# Patient Record
Sex: Female | Born: 1965
Health system: Southern US, Community
[De-identification: ages and names within clinical notes are randomized; demographics above are authoritative.]

## PROBLEM LIST (undated history)

## (undated) DIAGNOSIS — N6019 Diffuse cystic mastopathy of unspecified breast: Secondary | ICD-10-CM

## (undated) DIAGNOSIS — L509 Urticaria, unspecified: Secondary | ICD-10-CM

## (undated) DIAGNOSIS — L309 Dermatitis, unspecified: Secondary | ICD-10-CM

## (undated) DIAGNOSIS — E782 Mixed hyperlipidemia: Secondary | ICD-10-CM

## (undated) DIAGNOSIS — F419 Anxiety disorder, unspecified: Secondary | ICD-10-CM

## (undated) DIAGNOSIS — C4491 Basal cell carcinoma of skin, unspecified: Secondary | ICD-10-CM

## (undated) DIAGNOSIS — E7800 Pure hypercholesterolemia, unspecified: Secondary | ICD-10-CM

## (undated) DIAGNOSIS — D649 Anemia, unspecified: Secondary | ICD-10-CM

## (undated) DIAGNOSIS — A630 Anogenital (venereal) warts: Secondary | ICD-10-CM

## (undated) DIAGNOSIS — L723 Sebaceous cyst: Secondary | ICD-10-CM

## (undated) DIAGNOSIS — R079 Chest pain, unspecified: Secondary | ICD-10-CM

## (undated) DIAGNOSIS — M1990 Unspecified osteoarthritis, unspecified site: Secondary | ICD-10-CM

## (undated) DIAGNOSIS — T7840XA Allergy, unspecified, initial encounter: Secondary | ICD-10-CM

## (undated) HISTORY — DX: Anxiety disorder, unspecified: F41.9

## (undated) HISTORY — PX: WISDOM TOOTH EXTRACTION: SHX21

## (undated) HISTORY — DX: Anogenital (venereal) warts: A63.0

## (undated) HISTORY — DX: Unspecified osteoarthritis, unspecified site: M19.90

## (undated) HISTORY — DX: Sebaceous cyst: L72.3

## (undated) HISTORY — PX: BASAL CELL CARCINOMA EXCISION: SHX1214

## (undated) HISTORY — DX: Basal cell carcinoma of skin, unspecified: C44.91

## (undated) HISTORY — DX: Pure hypercholesterolemia, unspecified: E78.00

## (undated) HISTORY — DX: Allergy, unspecified, initial encounter: T78.40XA

## (undated) HISTORY — DX: Mixed hyperlipidemia: E78.2

## (undated) HISTORY — DX: Dermatitis, unspecified: L30.9

## (undated) HISTORY — DX: Anemia, unspecified: D64.9

## (undated) HISTORY — DX: Urticaria, unspecified: L50.9

## (undated) HISTORY — DX: Diffuse cystic mastopathy of unspecified breast: N60.19

## (undated) HISTORY — DX: Chest pain, unspecified: R07.9

---

## 1998-01-16 ENCOUNTER — Ambulatory Visit (HOSPITAL_COMMUNITY): Admission: RE | Admit: 1998-01-16 | Discharge: 1998-01-16 | Payer: Self-pay | Admitting: *Deleted

## 1998-05-07 ENCOUNTER — Ambulatory Visit (HOSPITAL_COMMUNITY): Admission: RE | Admit: 1998-05-07 | Discharge: 1998-05-07 | Payer: Self-pay | Admitting: *Deleted

## 1998-05-16 ENCOUNTER — Ambulatory Visit (HOSPITAL_COMMUNITY): Admission: RE | Admit: 1998-05-16 | Discharge: 1998-05-16 | Payer: Self-pay | Admitting: *Deleted

## 2000-05-13 ENCOUNTER — Other Ambulatory Visit: Admission: RE | Admit: 2000-05-13 | Discharge: 2000-05-13 | Payer: Self-pay | Admitting: Obstetrics and Gynecology

## 2000-11-29 ENCOUNTER — Inpatient Hospital Stay (HOSPITAL_COMMUNITY): Admission: AD | Admit: 2000-11-29 | Discharge: 2000-12-01 | Payer: Self-pay | Admitting: Obstetrics and Gynecology

## 2002-04-21 ENCOUNTER — Other Ambulatory Visit: Admission: RE | Admit: 2002-04-21 | Discharge: 2002-04-21 | Payer: Self-pay | Admitting: Obstetrics and Gynecology

## 2003-11-16 ENCOUNTER — Ambulatory Visit (HOSPITAL_BASED_OUTPATIENT_CLINIC_OR_DEPARTMENT_OTHER): Admission: RE | Admit: 2003-11-16 | Discharge: 2003-11-16 | Payer: Self-pay | Admitting: Plastic Surgery

## 2003-11-16 ENCOUNTER — Ambulatory Visit (HOSPITAL_COMMUNITY): Admission: RE | Admit: 2003-11-16 | Discharge: 2003-11-16 | Payer: Self-pay | Admitting: Plastic Surgery

## 2004-01-19 ENCOUNTER — Other Ambulatory Visit: Admission: RE | Admit: 2004-01-19 | Discharge: 2004-01-19 | Payer: Self-pay | Admitting: Obstetrics and Gynecology

## 2004-07-17 ENCOUNTER — Other Ambulatory Visit: Admission: RE | Admit: 2004-07-17 | Discharge: 2004-07-17 | Payer: Self-pay | Admitting: Obstetrics and Gynecology

## 2006-02-20 ENCOUNTER — Other Ambulatory Visit: Admission: RE | Admit: 2006-02-20 | Discharge: 2006-02-20 | Payer: Self-pay | Admitting: Obstetrics and Gynecology

## 2008-12-15 DIAGNOSIS — A630 Anogenital (venereal) warts: Secondary | ICD-10-CM

## 2008-12-15 HISTORY — DX: Anogenital (venereal) warts: A63.0

## 2011-02-25 ENCOUNTER — Other Ambulatory Visit: Payer: Self-pay | Admitting: Dermatology

## 2012-03-01 ENCOUNTER — Other Ambulatory Visit: Payer: Self-pay | Admitting: Dermatology

## 2012-04-28 ENCOUNTER — Telehealth: Payer: Self-pay | Admitting: Obstetrics and Gynecology

## 2012-04-28 NOTE — Telephone Encounter (Signed)
Laura/SR pt. °

## 2012-04-30 NOTE — Telephone Encounter (Signed)
LM with female at home number to return call  ld

## 2012-05-20 ENCOUNTER — Ambulatory Visit (INDEPENDENT_AMBULATORY_CARE_PROVIDER_SITE_OTHER): Payer: 59 | Admitting: Psychology

## 2012-05-20 DIAGNOSIS — F4323 Adjustment disorder with mixed anxiety and depressed mood: Secondary | ICD-10-CM

## 2012-06-03 ENCOUNTER — Ambulatory Visit (INDEPENDENT_AMBULATORY_CARE_PROVIDER_SITE_OTHER): Payer: 59 | Admitting: Psychology

## 2012-06-03 DIAGNOSIS — F4323 Adjustment disorder with mixed anxiety and depressed mood: Secondary | ICD-10-CM

## 2012-06-24 ENCOUNTER — Ambulatory Visit (INDEPENDENT_AMBULATORY_CARE_PROVIDER_SITE_OTHER): Payer: 59 | Admitting: Psychology

## 2012-06-24 DIAGNOSIS — F4323 Adjustment disorder with mixed anxiety and depressed mood: Secondary | ICD-10-CM

## 2012-07-13 ENCOUNTER — Ambulatory Visit: Payer: 59 | Admitting: Psychology

## 2012-11-29 ENCOUNTER — Encounter: Payer: Self-pay | Admitting: *Deleted

## 2012-11-30 ENCOUNTER — Encounter: Payer: Self-pay | Admitting: Cardiology

## 2012-11-30 ENCOUNTER — Ambulatory Visit (INDEPENDENT_AMBULATORY_CARE_PROVIDER_SITE_OTHER): Payer: 59 | Admitting: Cardiology

## 2012-11-30 VITALS — BP 107/64 | HR 68 | Ht 64.0 in | Wt 142.0 lb

## 2012-11-30 DIAGNOSIS — M79602 Pain in left arm: Secondary | ICD-10-CM

## 2012-11-30 DIAGNOSIS — R002 Palpitations: Secondary | ICD-10-CM

## 2012-11-30 DIAGNOSIS — M79609 Pain in unspecified limb: Secondary | ICD-10-CM

## 2012-11-30 NOTE — Patient Instructions (Signed)
Eliminate caffeine in your diet.  If you continue to have palpitations let me know and we can have you wear a monitor.

## 2012-11-30 NOTE — Progress Notes (Signed)
Gabriela Ramirez Date of Birth: 01/10/1966 Medical Record #[id number]  History of Present Illness: Gabriela Ramirez is seen at the request of Dr. Oneta Rack for evaluation of palpitations. She is a pleasant 44 are old white female who has been in excellent health. Recently she has had a lot of personal issues and marital stress. When she is under periods of stress she notices a sense of anxiety and tightness and she becomes somewhat agitated. She complains that her heart races. This is more common in the evenings when she is in a more relaxed state. She has noticed some pressure in her ears with activity. She complains of a stiffness in her left arm and it feels like it is heated. She does drink a lot of diet [facility]. Her only cardiac risk factor is history of mild hypercholesterolemia. She states her LDL was 120.  Current Outpatient Prescriptions on File Prior to Visit  Medication Sig Dispense Refill  . ALPRAZolam (XANAX) 0.25 MG tablet Take 0.25 mg by mouth 2 (two) times daily.        No Known Allergies  Past Medical History  Diagnosis Date  . Basal cell carcinoma     multiple  . Elevated cholesterol   . HPV (human papilloma virus) anogenital infection 2010  . Fibrocystic breast   . Sebaceous cyst     multiple  . Anxiety   . Chest pain     Past Surgical History  Procedure Date  . Basal cell carcinoma excision     multiple    History  Smoking status  . Never Smoker   Smokeless tobacco  . Not on file    History  Alcohol Use  . Yes    Comment: social    Family History  Problem Relation Age of Onset  . Adopted: Yes    Review of Systems: As noted in history of present illness.  All other systems were reviewed and are negative.  Physical Exam: BP 107/64  Pulse 68  Ht 5\' 4"  (1.626 m)  Wt 64.411 kg (142 lb)  BMI 24.37 kg/m2 She is a pleasant white female in no acute distress.The patient is alert and oriented x 3.  The mood and affect are normal.  The skin is warm  and dry.  Color is normal.  The HEENT exam reveals that the sclera are nonicteric.  The mucous membranes are moist.  The carotids are 2+ without bruits.  There is no thyromegaly.  There is no JVD.  The lungs are clear.  The chest wall is non tender.  The heart exam reveals a regular rate with a normal S1 and S2.  There are no murmurs, gallops, or rubs.  The PMI is not displaced.   Abdominal exam reveals good bowel sounds.  There is no guarding or rebound.  There is no hepatosplenomegaly or tenderness.  There are no masses.  Exam of the legs reveal no clubbing, cyanosis, or edema.  The legs are without rashes.  The distal pulses are intact.  Cranial nerves II - XII are intact.  Motor and sensory functions are intact.  The gait is normal.  LABORATORY DATA: ECG demonstrates normal sinus rhythm with mild nonspecific ST abnormality.  Assessment / Plan: 1. Palpitations. I suspect that these symptoms are predominantly related to stress. I have recommended that she limit her caffeine intake. We discussed further evaluation with an event monitor which she would like to wait and see how she does off of caffeine. If she  does have persistent palpitations she is to call is then we can arrange for her to wear a monitor. I think overall her risk is low.  2. Atypical left arm pain. Patient has a low risk of coronary disease. Her symptoms are very atypical. I've encouraged her to continue with her aerobic activity. I do not feel that stress testing is warranted.  3. Situational stress/anxiety.

## 2012-12-20 ENCOUNTER — Encounter: Payer: Self-pay | Admitting: Cardiology

## 2013-01-05 ENCOUNTER — Ambulatory Visit: Payer: Self-pay | Admitting: Obstetrics and Gynecology

## 2013-03-03 ENCOUNTER — Other Ambulatory Visit: Payer: Self-pay | Admitting: Dermatology

## 2013-04-04 ENCOUNTER — Other Ambulatory Visit: Payer: Self-pay | Admitting: Dermatology

## 2013-04-21 ENCOUNTER — Other Ambulatory Visit (HOSPITAL_COMMUNITY): Payer: Self-pay | Admitting: Internal Medicine

## 2013-04-21 DIAGNOSIS — D219 Benign neoplasm of connective and other soft tissue, unspecified: Secondary | ICD-10-CM

## 2013-04-26 ENCOUNTER — Ambulatory Visit (HOSPITAL_COMMUNITY)
Admission: RE | Admit: 2013-04-26 | Discharge: 2013-04-26 | Disposition: A | Discharge status: Home / Self Care | Payer: 59 | Source: Ambulatory Visit | Attending: Internal Medicine | Admitting: Internal Medicine

## 2013-04-26 DIAGNOSIS — D252 Subserosal leiomyoma of uterus: Secondary | ICD-10-CM | POA: Insufficient documentation

## 2013-04-26 DIAGNOSIS — D649 Anemia, unspecified: Secondary | ICD-10-CM | POA: Insufficient documentation

## 2013-04-26 DIAGNOSIS — N949 Unspecified condition associated with female genital organs and menstrual cycle: Secondary | ICD-10-CM | POA: Insufficient documentation

## 2013-04-26 DIAGNOSIS — D219 Benign neoplasm of connective and other soft tissue, unspecified: Secondary | ICD-10-CM

## 2013-04-26 DIAGNOSIS — N938 Other specified abnormal uterine and vaginal bleeding: Secondary | ICD-10-CM | POA: Insufficient documentation

## 2013-04-26 DIAGNOSIS — D250 Submucous leiomyoma of uterus: Secondary | ICD-10-CM | POA: Insufficient documentation

## 2013-07-21 ENCOUNTER — Encounter (HOSPITAL_COMMUNITY): Payer: Self-pay | Admitting: Pharmacist

## 2013-07-25 ENCOUNTER — Other Ambulatory Visit: Payer: Self-pay | Admitting: Obstetrics and Gynecology

## 2013-07-26 NOTE — Addendum Note (Signed)
Addended by: Silverio Lay on: 07/26/2013 10:28 AM   Modules accepted: Orders

## 2013-07-28 ENCOUNTER — Other Ambulatory Visit: Payer: Self-pay | Admitting: Obstetrics and Gynecology

## 2013-07-29 ENCOUNTER — Encounter (HOSPITAL_COMMUNITY): Payer: Self-pay | Admitting: Anesthesiology

## 2013-07-29 ENCOUNTER — Encounter (HOSPITAL_COMMUNITY)
Admission: RE | Disposition: A | Discharge status: Home / Self Care | Payer: Self-pay | Source: Ambulatory Visit | Attending: Obstetrics and Gynecology

## 2013-07-29 ENCOUNTER — Ambulatory Visit (HOSPITAL_COMMUNITY)
Admission: RE | Admit: 2013-07-29 | Discharge: 2013-07-29 | Disposition: A | Discharge status: Home / Self Care | Payer: 59 | Source: Ambulatory Visit | Attending: Obstetrics and Gynecology | Admitting: Obstetrics and Gynecology

## 2013-07-29 ENCOUNTER — Ambulatory Visit (HOSPITAL_COMMUNITY): Payer: 59 | Admitting: Anesthesiology

## 2013-07-29 DIAGNOSIS — N920 Excessive and frequent menstruation with regular cycle: Secondary | ICD-10-CM | POA: Insufficient documentation

## 2013-07-29 DIAGNOSIS — N854 Malposition of uterus: Secondary | ICD-10-CM | POA: Insufficient documentation

## 2013-07-29 DIAGNOSIS — E7800 Pure hypercholesterolemia, unspecified: Secondary | ICD-10-CM | POA: Insufficient documentation

## 2013-07-29 DIAGNOSIS — R002 Palpitations: Secondary | ICD-10-CM | POA: Insufficient documentation

## 2013-07-29 DIAGNOSIS — D250 Submucous leiomyoma of uterus: Secondary | ICD-10-CM | POA: Insufficient documentation

## 2013-07-29 DIAGNOSIS — N840 Polyp of corpus uteri: Secondary | ICD-10-CM | POA: Insufficient documentation

## 2013-07-29 HISTORY — PX: DILITATION & CURRETTAGE/HYSTROSCOPY WITH VERSAPOINT RESECTION: SHX5571

## 2013-07-29 HISTORY — PX: NOVASURE ABLATION: SHX5394

## 2013-07-29 LAB — PREGNANCY, URINE: Preg Test, Ur: NEGATIVE

## 2013-07-29 LAB — CBC
HCT: 35.8 % — ABNORMAL LOW (ref 36.0–46.0)
Hemoglobin: 11.6 g/dL — ABNORMAL LOW (ref 12.0–15.0)
MCH: 25.7 pg — ABNORMAL LOW (ref 26.0–34.0)
RBC: 4.51 MIL/uL (ref 3.87–5.11)

## 2013-07-29 SURGERY — DILATATION & CURETTAGE/HYSTEROSCOPY WITH VERSAPOINT RESECTION
Anesthesia: General | Site: Vagina | Wound class: Clean Contaminated

## 2013-07-29 MED ORDER — CHLOROPROCAINE HCL 1 % IJ SOLN
INTRAMUSCULAR | Status: DC | PRN
  Administered 2013-07-29: 20 mL

## 2013-07-29 MED ORDER — KETOROLAC TROMETHAMINE 30 MG/ML IJ SOLN
INTRAMUSCULAR | Status: DC | PRN
  Administered 2013-07-29: 30 mg via INTRAVENOUS

## 2013-07-29 MED ORDER — CHLOROPROCAINE HCL 1 % IJ SOLN
INTRAMUSCULAR | Status: AC
  Filled 2013-07-29: qty 30

## 2013-07-29 MED ORDER — MEPERIDINE HCL 25 MG/ML IJ SOLN: 6.2500 mg | INTRAMUSCULAR | Status: DC | PRN

## 2013-07-29 MED ORDER — LACTATED RINGERS IV SOLN
INTRAVENOUS | Status: DC
  Administered 2013-07-29 (×2): via INTRAVENOUS

## 2013-07-29 MED ORDER — SCOPOLAMINE 1 MG/3DAYS TD PT72
MEDICATED_PATCH | TRANSDERMAL | Status: AC
  Filled 2013-07-29: qty 1

## 2013-07-29 MED ORDER — PROMETHAZINE HCL 25 MG/ML IJ SOLN: 6.2500 mg | INTRAMUSCULAR | Status: DC | PRN

## 2013-07-29 MED ORDER — KETOROLAC TROMETHAMINE 60 MG/2ML IM SOLN
INTRAMUSCULAR | Status: DC | PRN
  Administered 2013-07-29: 30 mg via INTRAMUSCULAR

## 2013-07-29 MED ORDER — CEFAZOLIN SODIUM-DEXTROSE 2-3 GM-% IV SOLR: 2.0000 g | INTRAVENOUS | Status: DC

## 2013-07-29 MED ORDER — MIDAZOLAM HCL 2 MG/2ML IJ SOLN: 0.5000 mg | Freq: Once | INTRAMUSCULAR | Status: DC | PRN

## 2013-07-29 MED ORDER — SODIUM CHLORIDE 0.9 % IR SOLN
Status: DC | PRN
  Administered 2013-07-29: 3000 mL

## 2013-07-29 MED ORDER — PROPOFOL 10 MG/ML IV BOLUS
INTRAVENOUS | Status: DC | PRN
  Administered 2013-07-29: 200 mg via INTRAVENOUS

## 2013-07-29 MED ORDER — FENTANYL CITRATE 0.05 MG/ML IJ SOLN
INTRAMUSCULAR | Status: DC | PRN
  Administered 2013-07-29 (×2): 25 ug via INTRAVENOUS
  Administered 2013-07-29: 100 ug via INTRAVENOUS

## 2013-07-29 MED ORDER — SCOPOLAMINE 1 MG/3DAYS TD PT72
MEDICATED_PATCH | TRANSDERMAL | Status: DC | PRN
  Administered 2013-07-29: 1 via TRANSDERMAL

## 2013-07-29 MED ORDER — FENTANYL CITRATE 0.05 MG/ML IJ SOLN
INTRAMUSCULAR | Status: AC
  Filled 2013-07-29: qty 2

## 2013-07-29 MED ORDER — KETOROLAC TROMETHAMINE 30 MG/ML IJ SOLN
INTRAMUSCULAR | Status: AC
  Filled 2013-07-29: qty 2

## 2013-07-29 MED ORDER — GLYCOPYRROLATE 0.2 MG/ML IJ SOLN
INTRAMUSCULAR | Status: AC
  Filled 2013-07-29: qty 1

## 2013-07-29 MED ORDER — CEFAZOLIN SODIUM-DEXTROSE 2-3 GM-% IV SOLR
2.0000 g | INTRAVENOUS | Status: AC
  Administered 2013-07-29: 2 g via INTRAVENOUS

## 2013-07-29 MED ORDER — HYDROCODONE-ACETAMINOPHEN 5-300 MG PO TABS: ORAL_TABLET | ORAL | Status: DC

## 2013-07-29 MED ORDER — LIDOCAINE HCL (CARDIAC) 20 MG/ML IV SOLN
INTRAVENOUS | Status: AC
  Filled 2013-07-29: qty 5

## 2013-07-29 MED ORDER — MIDAZOLAM HCL 2 MG/2ML IJ SOLN
INTRAMUSCULAR | Status: AC
  Filled 2013-07-29: qty 2

## 2013-07-29 MED ORDER — LIDOCAINE HCL (CARDIAC) 20 MG/ML IV SOLN
INTRAVENOUS | Status: DC | PRN
  Administered 2013-07-29: 60 mg via INTRAVENOUS

## 2013-07-29 MED ORDER — PROPOFOL 10 MG/ML IV EMUL
INTRAVENOUS | Status: AC
  Filled 2013-07-29: qty 20

## 2013-07-29 MED ORDER — IBUPROFEN 600 MG PO TABS: 600.0000 mg | ORAL_TABLET | Freq: Four times a day (QID) | ORAL | Status: DC | PRN

## 2013-07-29 MED ORDER — DEXAMETHASONE SODIUM PHOSPHATE 4 MG/ML IJ SOLN
INTRAMUSCULAR | Status: DC | PRN
  Administered 2013-07-29: 10 mg via INTRAVENOUS

## 2013-07-29 MED ORDER — DEXAMETHASONE SODIUM PHOSPHATE 10 MG/ML IJ SOLN
INTRAMUSCULAR | Status: AC
  Filled 2013-07-29: qty 1

## 2013-07-29 MED ORDER — ONDANSETRON HCL 4 MG/2ML IJ SOLN
INTRAMUSCULAR | Status: AC
  Filled 2013-07-29: qty 2

## 2013-07-29 MED ORDER — MIDAZOLAM HCL 5 MG/5ML IJ SOLN
INTRAMUSCULAR | Status: DC | PRN
  Administered 2013-07-29: 2 mg via INTRAVENOUS

## 2013-07-29 MED ORDER — FENTANYL CITRATE 0.05 MG/ML IJ SOLN: 25.0000 ug | INTRAMUSCULAR | Status: DC | PRN

## 2013-07-29 MED ORDER — KETOROLAC TROMETHAMINE 30 MG/ML IJ SOLN: 15.0000 mg | Freq: Once | INTRAMUSCULAR | Status: DC | PRN

## 2013-07-29 MED ORDER — ONDANSETRON HCL 4 MG/2ML IJ SOLN
INTRAMUSCULAR | Status: DC | PRN
  Administered 2013-07-29: 4 mg via INTRAVENOUS

## 2013-07-29 MED ORDER — CEFAZOLIN SODIUM-DEXTROSE 2-3 GM-% IV SOLR
INTRAVENOUS | Status: AC
  Filled 2013-07-29: qty 50

## 2013-07-29 MED ORDER — GLYCOPYRROLATE 0.2 MG/ML IJ SOLN
INTRAMUSCULAR | Status: DC | PRN
  Administered 2013-07-29: 0.1 mg via INTRAVENOUS

## 2013-07-29 SURGICAL SUPPLY — 25 items
ABLATOR ENDOMETRIAL BIPOLAR (ABLATOR) ×2 IMPLANT
BOOTIES KNEE HIGH SLOAN (MISCELLANEOUS) ×4 IMPLANT
CANISTER SUCTION 2500CC (MISCELLANEOUS) ×2 IMPLANT
CATH ROBINSON RED A/P 16FR (CATHETERS) ×2 IMPLANT
CLOTH BEACON ORANGE TIMEOUT ST (SAFETY) ×2 IMPLANT
CONTAINER PREFILL 10% NBF 60ML (FORM) ×4 IMPLANT
CORD ACTIVE DISPOSABLE (ELECTRODE)
CORD ELECTRO ACTIVE DISP (ELECTRODE) IMPLANT
DRESSING TELFA 8X3 (GAUZE/BANDAGES/DRESSINGS) ×2 IMPLANT
ELECT REM PT RETURN 9FT ADLT (ELECTROSURGICAL) ×2
ELECTRODE REM PT RTRN 9FT ADLT (ELECTROSURGICAL) ×1 IMPLANT
ELECTRODE ROLLER VERSAPOINT (ELECTRODE) IMPLANT
ELECTRODE RT ANGLE VERSAPOINT (CUTTING LOOP) ×2 IMPLANT
GLOVE BIOGEL PI IND STRL 7.0 (GLOVE) ×2 IMPLANT
GLOVE BIOGEL PI INDICATOR 7.0 (GLOVE) ×2
GOWN PREVENTION PLUS LG XLONG (DISPOSABLE) ×4 IMPLANT
GOWN STRL REIN XL XLG (GOWN DISPOSABLE) ×4 IMPLANT
LOOP ANGLED CUTTING 22FR (CUTTING LOOP) IMPLANT
NEEDLE SPNL 22GX3.5 QUINCKE BK (NEEDLE) ×2 IMPLANT
PACK HYSTEROSCOPY LF (CUSTOM PROCEDURE TRAY) ×2 IMPLANT
PACK VAGINAL MINOR WOMEN LF (CUSTOM PROCEDURE TRAY) ×2 IMPLANT
PAD OB MATERNITY 4.3X12.25 (PERSONAL CARE ITEMS) ×2 IMPLANT
PAD PREP 24X48 CUFFED NSTRL (MISCELLANEOUS) ×2 IMPLANT
TOWEL OR 17X24 6PK STRL BLUE (TOWEL DISPOSABLE) ×2 IMPLANT
WATER STERILE IRR 1000ML POUR (IV SOLUTION) ×2 IMPLANT

## 2013-07-29 NOTE — Anesthesia Preprocedure Evaluation (Addendum)
Anesthesia Evaluation  Patient identified by MRN, date of birth, ID band Patient awake    Reviewed: Allergy & Precautions, H&P , Patient's Chart, lab work & pertinent test results, reviewed documented beta blocker date and time   History of Anesthesia Complications Negative for: history of anesthetic complications  Airway Mallampati: I TM Distance: >3 FB Neck ROM: full    Dental no notable dental hx. (+) Teeth Intact   Pulmonary neg pulmonary ROS,  breath sounds clear to auscultation  Pulmonary exam normal       Cardiovascular Exercise Tolerance: Good negative cardio ROS  Rhythm:regular Rate:Normal     Neuro/Psych negative neurological ROS  negative psych ROS   GI/Hepatic negative GI ROS, Neg liver ROS,   Endo/Other  negative endocrine ROS  Renal/GU negative Renal ROS  Female GU complaint     Musculoskeletal   Abdominal Normal abdominal exam  (+)   Peds  Hematology negative hematology ROS (+)   Anesthesia Other Findings   Reproductive/Obstetrics negative OB ROS                          Anesthesia Physical Anesthesia Plan  ASA: I  Anesthesia Plan: General LMA   Post-op Pain Management:    Induction:   Airway Management Planned:   Additional Equipment:   Intra-op Plan:   Post-operative Plan:   Informed Consent: I have reviewed the patients History and Physical, chart, labs and discussed the procedure including the risks, benefits and alternatives for the proposed anesthesia with the patient or authorized representative who has indicated his/her understanding and acceptance.   Dental Advisory Given  Plan Discussed with: CRNA, Surgeon and Anesthesiologist  Anesthesia Plan Comments:        Anesthesia Quick Evaluation

## 2013-07-29 NOTE — Anesthesia Procedure Notes (Signed)
Procedure Name: LMA Insertion Date/Time: 07/29/2013 10:26 AM Performed by: Graciela Husbands Pre-anesthesia Checklist: Suction available, Emergency Drugs available, Timeout performed, Patient identified and Patient being monitored Patient Re-evaluated:Patient Re-evaluated prior to inductionOxygen Delivery Method: Circle system utilized Preoxygenation: Pre-oxygenation with 100% oxygen Intubation Type: IV induction Ventilation: Mask ventilation without difficulty LMA: LMA inserted LMA Size: 4.0 Number of attempts: 1 Placement Confirmation: breath sounds checked- equal and bilateral and positive ETCO2 Tube secured with: Tape Dental Injury: Teeth and Oropharynx as per pre-operative assessment

## 2013-07-29 NOTE — H&P (Signed)
  Chief Complaint Menorrhagia  Past Medical History: Human papilloma virus infection - Onset: 04/2007\ls1 Palpitations Hypercholesterolemia  Allergies NKDA  Medications  Name Date Source  magnesium 05/28/14entered Jeannie Martin  Slow Release Iron\pardevery other day 05/28/14entered Marissa Nestle  Vitamin D3 5,000 unit tablet\pardTake 1 tablet(s) twice a day by oral route. 05/28/14entered Marissa Nestle   GYN History G2P2 Cycle: Monthly  flow: lasts 7 days  abnormal PAP:  04/2007 ASCUS +HPV Birth Control Method: Vasectomy. Partner Vasectomy.       LIVING           Social History Never smoker. Alcohol: [email or web address] drugs: NONE. 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  Family History:ADOPTED   Surgical History: wisdomw teeth and gyn colposcopy        Wt:          142 lbs 06/29/2013 03:22 pm          Ht:          5 ft 4 in 06/29/2013 02:05 pm          BMI:          24.4 06/29/2013 03:22 pm  BP: 106/62 sitting R arm 06/29/2013 03:22 pm      ROS Constitutional: Constitutional: no fever, fatigue, or significant weight loss/gain Cardiovascular: Cardiovascular: no palpitations, SOB, orthopnea, edema, or chest pain. Gastrointestinal: Gastrointestinal: no heartburn, indigestion, nausea, vomiting, difficulty swallowing, abdominal pain, or bowel movement changes. Genitourinary: Genitourinary: no vaginal odor or itching and no hematuria, incontinence, rash, lesion, discharge, abnormal bleeding, flank pain, or trouble urinating. Endocrine: Endocrine: no endocrine symptoms. Menstrual cycle: no PMDD symptoms. Menopausal: no menopausal symptoms. Sexual problems: no sexual problems. Psychological: Psychiatric: no depression or alcoholism.  Physical Exam Patient is a 47 year old female Constitutional: General Appearance: healthy-appearing, well-nourished, and well-developed.\ Psychiatric: Orientation: to time, place, and person.  Mood and Affect: normal mood and affect and active and alert.\ Skin: Appearance: no rashes or lesions.\ Ears, Nose, Throat: Oral Cavity general condition: good and (normal) teeth. Ears ENT WNL. Nose (normal) nose. Throat (normal) throat.\ Neck: Neck: supple, FROM, trachea midline, and no masses. Thyroid: no enlargement or nodules and non-tender.\ Lungs: Respiratory Effort: no intercostal retractions or accessory muscle usage. Auscultation: no wheezing, rales/crackles, or rhonchi and clear to auscultation.\ Cardiovascular: Auscultation: RRR and no murmur. Peripheral Vascular: no varicosities, LLE edema, RLE edema, calf tenderness, or palpable cords and pedal pulses intact.\ Abdomen: Auscultation/Inspection/Palpation: no tenderness, hepatomegaly, splenomegaly, masses, or CVA tenderness and soft, non-distended, and normal bowel sounds. Hernia: none palpated.\ Breast: Inspection/Palpation: no tenderness, skin changes, abnormal secretions, or distinct masses and nipple appearance normal.\ Female Genitalia: Vulva: no masses, atrophy, or lesions. Vagina: no tenderness, erythema, cystocele, rectocele, abnormal vaginal discharge, or vesicle(s) or ulcers. Cervix: no discharge or cervical motion tenderness and grossly normal. Uterus: normal size and shape and midline, mobile, non-tender, and no uterine prolapse. Bladder/Urethra: no urethral discharge or mass and normal meatus, bladder non distended, and Urethra well supported. Adnexa/Parametria: no parametrial tenderness or mass and no adnexal tenderness or ovarian mass.\ Lymph Nodes: Palpation: non tender submandibular nodes, axillary nodes, or inguinal nodes.\SHG reveals 3 submucosal fibroid with a normal measuring cavity.  SHG: 2.3 cm right lower wall submucosal fibroid and 1 cl LUS fibroid  Assessment / Plan  HSC Versapoint myomectomy Novasure endometrial ablation Procedure, R&B reviewed with pt. Questions answered

## 2013-07-29 NOTE — Transfer of Care (Signed)
Immediate Anesthesia Transfer of Care Note  Patient: Gabriela Ramirez  Procedure(s) Performed: Procedure(s) with comments: DILATATION & CURETTAGE/HYSTEROSCOPY WITH VERSAPOINT RESECTION (N/A) - Hysteroscopy, Versapoint Myomectomy, D&C, Novasure   NOVASURE ABLATION (N/A)  Patient Location: PACU  Anesthesia Type:General  Level of Consciousness: awake, alert  and oriented  Airway & Oxygen Therapy: Patient Spontanous Breathing and Patient connected to nasal cannula oxygen  Post-op Assessment: Report given to PACU RN and Post -op Vital signs reviewed and stable  Post vital signs: Reviewed and stable  Complications: No apparent anesthesia complications

## 2013-07-29 NOTE — Op Note (Signed)
Preop diagnosis: DUB  Postop diagnosis: same  Anesthesia: general   Anesthesiologist: Dr. Dana Allan  Procedure: Hysteroscopy, resection of endometrial polyps, Versapoint myomectomy, D&C and Novasure endometrial ablation  Surgeon: Dr. Dois Davenport Artem Bunte  Procedure: After being informed of the planned procedure with possible complications including bleeding, infection and uterine perforation, informed consent was obtained and patient was taken to or #8.  She was given general anesthesia without complication. She was placed in a dorsal decubitus position, prepped and draped in the sterile fashion and a Foley catheter was inserted in her bladder. Pelvic exam reveals retroverted uterus normal in size and 2 normal adnexa.  A weighted speculum is inserted in the vagina. The cervix was grasped with a tenaculum forcep placed on the anterior lip.We proceed with a paracervical block using 1% Nesacaine, 20 cc. Uterus is sounded at 9 and cervix is measured at 3 cm. The cervix is then easily dilated using Hegar dilator until # 33. This allows for easy placement of a Versapoint hysteroscope. With perfusion of normal saline at a maximum pressure of 90 mmHg, we are able to evaluate the entire uterine cavity.  Observation: 2 endometrial polyps in the right LUS measuring 1 and 1 cm as well as a 2.5 cm submucosal fibroid on the right lateral wall. Both tubal ostia are seen and normal.  We proceed with the resection of the 2 polyps and the fibroid without difficulty. We then removed our instrumentation. Using a sharp curette, we proceed with curettage of the endometrial cavity which returns a small amount of normal-appearing endometrium.  Novasure is easily inserted in the uterine cavity which now has a normal configuration and set at 6 cm length and 4.5 cm width. Cavity integrity is confirmed and the Novasure is triggered for 64 seconds. Novasure is removed and a second look hysteroscopy reveals a nicely blanched  cavity.  Instruments are then removed. The anterior lip of the cervix is repaired with 2-0 Vicryl. Instrument and sponge count is complete x2. Estimated blood loss is minimal. Water deficit is 140 cc of normal saline..  The procedure is very well tolerated by the patient who is taken to recovery room in a well and stable condition.  Specimen: Endometrial polyps,endometrial curettings and fragments of fibroid are sent to pathology.

## 2013-07-29 NOTE — Interval H&P Note (Signed)
History and Physical Interval Note:  07/29/2013 9:49 AM  Gabriela Ramirez  has presented today for surgery, with the diagnosis of menorrhagia, submucosal fibroids,   The various methods of treatment have been discussed with the patient and family. After consideration of risks, benefits and other options for treatment, the patient has consented to  Procedure(s) with comments: DILATATION & CURETTAGE/HYSTEROSCOPY WITH VERSAPOINT RESECTION (N/A) - Hysteroscopy, Versapoint Myomectomy, D&C, Novasure   NOVASURE ABLATION (N/A) as a surgical intervention .  The patient's history has been reviewed, patient examined, no change in status, stable for surgery.  I have reviewed the patient's chart and labs.  Questions were answered to the patient's satisfaction.     Merril Nagy A

## 2013-08-01 ENCOUNTER — Encounter (HOSPITAL_COMMUNITY): Payer: Self-pay | Admitting: Obstetrics and Gynecology

## 2013-08-05 NOTE — Anesthesia Postprocedure Evaluation (Signed)
  Anesthesia Post-op Note  Anesthesia Post Note  Patient: Gabriela Ramirez  Procedure(s) Performed: Procedure(s) (LRB): DILATATION & CURETTAGE/HYSTEROSCOPY WITH VERSAPOINT RESECTION (N/A) NOVASURE ABLATION (N/A)  Anesthesia type: General  Patient location: PACU  Post pain: Pain level controlled  Post assessment: Post-op Vital signs reviewed  Last Vitals:  Filed Vitals:   07/29/13 1223  BP:   Pulse:   Temp: 36.4 C  Resp: 18    Post vital signs: Reviewed  Level of consciousness: sedated  Complications: No apparent anesthesia complications

## 2013-10-20 ENCOUNTER — Other Ambulatory Visit: Payer: Self-pay

## 2013-11-12 ENCOUNTER — Encounter: Payer: Self-pay | Admitting: Internal Medicine

## 2013-11-12 DIAGNOSIS — D649 Anemia, unspecified: Secondary | ICD-10-CM | POA: Insufficient documentation

## 2013-11-14 ENCOUNTER — Ambulatory Visit (INDEPENDENT_AMBULATORY_CARE_PROVIDER_SITE_OTHER): Payer: 59 | Admitting: Emergency Medicine

## 2013-11-14 ENCOUNTER — Other Ambulatory Visit: Payer: Self-pay | Admitting: Emergency Medicine

## 2013-11-14 ENCOUNTER — Encounter: Payer: Self-pay | Admitting: Emergency Medicine

## 2013-11-14 VITALS — BP 104/62 | HR 54 | Temp 98.0°F | Resp 16 | Ht 65.25 in | Wt 147.0 lb

## 2013-11-14 DIAGNOSIS — Z0000 Encounter for general adult medical examination without abnormal findings: Secondary | ICD-10-CM

## 2013-11-14 DIAGNOSIS — Z1212 Encounter for screening for malignant neoplasm of rectum: Secondary | ICD-10-CM

## 2013-11-14 DIAGNOSIS — Z111 Encounter for screening for respiratory tuberculosis: Secondary | ICD-10-CM

## 2013-11-14 DIAGNOSIS — R21 Rash and other nonspecific skin eruption: Secondary | ICD-10-CM

## 2013-11-14 LAB — BASIC METABOLIC PANEL WITH GFR
CO2: 28 mEq/L (ref 19–32)
Calcium: 9.3 mg/dL (ref 8.4–10.5)
Creat: 0.64 mg/dL (ref 0.50–1.10)
GFR, Est African American: >89 mL/min
GFR, Est Non African American: >89 mL/min
Sodium: 136 mEq/L (ref 135–145)

## 2013-11-14 LAB — CBC WITH DIFFERENTIAL/PLATELET
Basophils Absolute: 0 10*3/uL (ref 0.0–0.1)
Basophils Relative: 1 % (ref 0–1)
Eosinophils Absolute: 0.2 10*3/uL (ref 0.0–0.7)
Eosinophils Relative: 3 % (ref 0–5)
MCH: 27.3 pg (ref 26.0–34.0)
MCHC: 33.2 g/dL (ref 30.0–36.0)
MCV: 82.3 fL (ref 78.0–100.0)
Neutrophils Relative %: 51 % (ref 43–77)
Platelets: 262 10*3/uL (ref 150–400)
RBC: 4.8 MIL/uL (ref 3.87–5.11)
RDW: 15 % (ref 11.5–15.5)

## 2013-11-14 LAB — LIPID PANEL
HDL: 66 mg/dL (ref >39)
LDL Cholesterol: 110 mg/dL — ABNORMAL HIGH (ref 0–99)
VLDL: 12 mg/dL (ref 0–40)

## 2013-11-14 LAB — IRON AND TIBC
%SAT: 11 % — ABNORMAL LOW (ref 20–55)
Iron: 53 ug/dL (ref 42–145)
TIBC: 494 ug/dL — ABNORMAL HIGH (ref 250–470)

## 2013-11-14 LAB — TSH: TSH: 1.44 u[IU]/mL (ref 0.350–4.500)

## 2013-11-14 LAB — HEPATIC FUNCTION PANEL
Alkaline Phosphatase: 52 U/L (ref 39–117)
Bilirubin, Direct: 0.1 mg/dL (ref 0.0–0.3)
Indirect Bilirubin: 0.5 mg/dL (ref 0.0–0.9)
Total Protein: 7.2 g/dL (ref 6.0–8.3)

## 2013-11-14 LAB — HEMOGLOBIN A1C: Hgb A1c MFr Bld: 5.6 % (ref <5.7)

## 2013-11-14 MED ORDER — PREDNISONE 10 MG PO TABS: ORAL_TABLET | ORAL | Status: DC

## 2013-11-14 NOTE — Patient Instructions (Addendum)
Anemia, Nonspecific Anemia is a condition in which the concentration of red blood cells or hemoglobin in the blood is below normal. Hemoglobin is a substance in red blood cells that carries oxygen to the tissues of the body. Anemia results in not enough oxygen reaching these tissues.  CAUSES  Common causes of anemia include:   Excessive bleeding. Bleeding may be internal or external. This includes excessive bleeding from periods (in women) or from the intestine.   Poor nutrition.   Chronic kidney, thyroid, and liver disease.  Bone marrow disorders that decrease red blood cell production.  Cancer and treatments for cancer.  HIV, AIDS, and their treatments.  Spleen problems that increase red blood cell destruction.  Blood disorders.  Excess destruction of red blood cells due to infection, medicines, and autoimmune disorders. SIGNS AND SYMPTOMS   Minor weakness.   Dizziness.   Headache.  Palpitations.   Shortness of breath, especially with exercise.   Paleness.  Cold sensitivity.  Indigestion.  Nausea.  Difficulty sleeping.  Difficulty concentrating. Symptoms may occur suddenly or they may develop slowly.  DIAGNOSIS  Additional blood tests are often needed. These help your health care provider determine the best treatment. Your health care provider will check your stool for blood and look for other causes of blood loss.  TREATMENT  Treatment varies depending on the cause of the anemia. Treatment can include:   Supplements of iron, vitamin B12, or folic acid.   Hormone medicines.   A blood transfusion. This may be needed if blood loss is severe.   Hospitalization. This may be needed if there is significant continual blood loss.   Dietary changes.  Spleen removal. HOME CARE INSTRUCTIONS Keep all follow-up appointments. It often takes many weeks to correct anemia, and having your health care provider check on your condition and your response to  treatment is very important. SEEK IMMEDIATE MEDICAL CARE IF:   You develop extreme weakness, shortness of breath, or chest pain.   You become dizzy or have trouble concentrating.  You develop heavy vaginal bleeding.   You develop a rash.   You have bloody or black, tarry stools.   You faint.   You vomit up blood.   You vomit repeatedly.   You have abdominal pain.  You have a fever or persistent symptoms for more than 2 3 days.   You have a fever and your symptoms suddenly get worse.   You are dehydrated.  MAKE SURE YOU:  Understand these instructions.  Will watch your condition.  Will get help right away if you are not doing well or get worse. Document Released: 01/08/2005 Document Revised: 08/03/2013 Document Reviewed: 05/27/2013 [facility] Patient Information 2014 [place], [place]. Rash A rash is a change in the color or feel of your skin. There are many different types of rashes. You may have other problems along with your rash. HOME CARE  Avoid the thing that caused your rash.  Do not scratch your rash.  You may take cools baths to help stop itching.  Only take medicines as told by your doctor.  Keep all doctor visits as told. GET HELP RIGHT AWAY IF:   Your pain, puffiness (swelling), or redness gets worse.  You have a fever.  You have new or severe problems.  You have body aches, watery poop (diarrhea), or you throw up (vomit).  Your rash is not better after 3 days. MAKE SURE YOU:   Understand these instructions.  Will watch your condition.  Will get  help right away if you are not doing well or get worse. Document Released: 05/19/2008 Document Revised: 02/23/2012 Document Reviewed: 09/15/2011 [facility] Patient Information 2014 [place], [place].

## 2013-11-15 LAB — URINALYSIS, MICROSCOPIC ONLY
Bacteria, UA: NONE SEEN
Casts: NONE SEEN
Squamous Epithelial / LPF: NONE SEEN

## 2013-11-15 LAB — INSULIN, FASTING: Insulin fasting, serum: 4 u[IU]/mL (ref 3–28)

## 2013-11-15 LAB — URINALYSIS, ROUTINE W REFLEX MICROSCOPIC
Bilirubin Urine: NEGATIVE
Glucose, UA: NEGATIVE mg/dL
Protein, ur: NEGATIVE mg/dL
pH: 6 (ref 5.0–8.0)

## 2013-11-16 LAB — URINE CULTURE
Colony Count: NO GROWTH
Organism ID, Bacteria: NO GROWTH

## 2013-11-16 NOTE — Progress Notes (Signed)
Subjective:    Patient ID: Gabriela Ramirez, female    DOB: May 20, 1966, 47 y.o.   MRN: [id number]  HPI Comments: 47 yo WF CPE with history of anemia nad vitamin defiency. She has stopped all supplements and is feeling well overall. She notes fatigue only when stress level is elevated. She notes stress has improved with separation from spouse. She is not eating as healthy or exercising as regularly as she was previously.  She has noticed a mildly irritated rash around neck and upper back. She has tried to change lotion and soap to see if any improvement with out relief.  She did have fibroidectomy/ ablation in 8/14 and is still having occasional cycles but improved from prior to surgery.   Anemia    Current Outpatient Prescriptions on File Prior to Visit  Medication Sig Dispense Refill  . Cholecalciferol (VITAMIN D3 MAXIMUM STRENGTH) 5000 UNITS capsule Take 5,000 Units by mouth 2 (two) times daily.      . Ferrous Sulfate (SLOW FE PO) Take by mouth daily.      . fexofenadine-pseudoephedrine (ALLEGRA-D 24) 180-240 MG per 24 hr tablet Take 1 tablet by mouth daily as needed.      . Hydrocodone-Acetaminophen (VICODIN) 5-300 MG TABS Take 1-2 tablets by mouth every 4-6 hours as needed for pain  24 each  0  . ibuprofen (ADVIL,MOTRIN) 600 MG tablet Take 1 tablet (600 mg total) by mouth every 6 (six) hours as needed for pain.  30 tablet  0  . Magnesium 250 MG TABS Take by mouth daily.       No current facility-administered medications on file prior to visit.    Review of patient's allergies indicates no known allergies.  Past Medical History  Diagnosis Date  . Basal cell carcinoma     multiple  . Elevated cholesterol   . HPV (human papilloma virus) anogenital infection 2010  . Fibrocystic breast   . Sebaceous cyst     multiple  . Anxiety   . Chest pain   . Anemia     Past Surgical History  Procedure Laterality Date  . Basal cell carcinoma excision      multiple  . Dilitation &  currettage/hystroscopy with versapoint resection N/A 07/29/2013    Procedure: DILATATION & CURETTAGE/HYSTEROSCOPY WITH VERSAPOINT RESECTION;  Surgeon: Esmeralda Arthur, MD;  Location: [facility];  Service: Gynecology;  Laterality: N/A;  Hysteroscopy, Versapoint Myomectomy, D&C, Novasure    . Novasure ablation N/A 07/29/2013    Procedure: NOVASURE ABLATION;  Surgeon: Esmeralda Arthur, MD;  Location: [facility];  Service: Gynecology;  Laterality: N/A;    History   Social History  . Marital Status: Married    Spouse Name: N/A    Number of Children: 2  . Years of Education: N/A   Occupational History  . staffing firm manager    Social History Main Topics  . Smoking status: Never Smoker   . Smokeless tobacco: None  . Alcohol Use: Yes     Comment: social  . Drug Use: No  . Sexual Activity: None   Other Topics Concern  . None   Social History Narrative  . None    Family History  Problem Relation Age of Onset  . Adopted: Yes     Review of Systems  Skin: Positive for rash.  All other systems reviewed and are negative.   BP 104/62  Pulse 54  Temp(Src) 98 F (36.7 C) (Temporal)  Resp 16  Ht 5'  5.25" (1.657 m)  Wt 147 lb (66.679 kg)  BMI 24.29 kg/m2  LMP 11/09/2013     Objective:   Physical Exam  Nursing note and vitals reviewed. Constitutional: She is oriented to person, place, and time. She appears well-developed and well-nourished. No distress.  HENT:  Head: Normocephalic and atraumatic.  Right Ear: External ear normal.  Left Ear: External ear normal.  Nose: Nose normal.  Mouth/Throat: Oropharynx is clear and moist. No oropharyngeal exudate.  Eyes: Conjunctivae and EOM are normal. Pupils are equal, round, and reactive to light. No scleral icterus.  Neck: Normal range of motion. Neck supple. No JVD present. No tracheal deviation present. No thyromegaly present.  Cardiovascular: Normal rate, regular rhythm, normal heart sounds and intact distal pulses.   Pulmonary/Chest:  Effort normal and breath sounds normal.  Abdominal: Soft. Bowel sounds are normal. She exhibits no distension and no mass. There is no tenderness. There is no rebound and no guarding.  Genitourinary:  DEF to gyn  Musculoskeletal: Normal range of motion. She exhibits no edema and no tenderness.  Lymphadenopathy:    She has no cervical adenopathy.  Neurological: She is alert and oriented to person, place, and time. She has normal reflexes. No cranial nerve deficit. Coordination normal.  Skin: Skin is warm and dry. Rash noted. No erythema. No pallor.  Upper chest/ back, neck erythematous mildly elevated splotchy, non patterned rash  Psychiatric: She has a normal mood and affect. Her behavior is normal. Judgment and thought content normal.          Assessment & Plan:  1. CPE recheck screening labs 2. Rash upper chest/ Back, hygiene explained , Pred 10 mg DP, Benadryl AD if no change f/u Derm

## 2014-04-04 ENCOUNTER — Ambulatory Visit (INDEPENDENT_AMBULATORY_CARE_PROVIDER_SITE_OTHER): Payer: 59 | Admitting: Emergency Medicine

## 2014-04-04 ENCOUNTER — Encounter: Payer: Self-pay | Admitting: Emergency Medicine

## 2014-04-04 VITALS — BP 118/62 | HR 58 | Temp 98.6°F | Resp 16 | Ht 65.0 in | Wt 148.0 lb

## 2014-04-04 DIAGNOSIS — E559 Vitamin D deficiency, unspecified: Secondary | ICD-10-CM

## 2014-04-04 DIAGNOSIS — R5383 Other fatigue: Secondary | ICD-10-CM

## 2014-04-04 DIAGNOSIS — R635 Abnormal weight gain: Secondary | ICD-10-CM

## 2014-04-04 DIAGNOSIS — E782 Mixed hyperlipidemia: Secondary | ICD-10-CM

## 2014-04-04 DIAGNOSIS — R5381 Other malaise: Secondary | ICD-10-CM

## 2014-04-04 HISTORY — DX: Mixed hyperlipidemia: E78.2

## 2014-04-04 LAB — LIPID PANEL
Cholesterol: 176 mg/dL (ref 0–200)
HDL: 67 mg/dL (ref >39)
LDL CALC: 96 mg/dL (ref 0–99)
Total CHOL/HDL Ratio: 2.6 Ratio
Triglycerides: 63 mg/dL (ref <150)
VLDL: 13 mg/dL (ref 0–40)

## 2014-04-04 LAB — HEPATIC FUNCTION PANEL
ALT: 15 U/L (ref 0–35)
AST: 20 U/L (ref 0–37)
Albumin: 4.2 g/dL (ref 3.5–5.2)
Alkaline Phosphatase: 51 U/L (ref 39–117)
BILIRUBIN INDIRECT: 0.6 mg/dL (ref 0.2–1.2)
Bilirubin, Direct: 0.2 mg/dL (ref 0.0–0.3)
Total Bilirubin: 0.8 mg/dL (ref 0.2–1.2)
Total Protein: 6.7 g/dL (ref 6.0–8.3)

## 2014-04-04 LAB — CBC WITH DIFFERENTIAL/PLATELET
BASOS ABS: 0.1 10*3/uL (ref 0.0–0.1)
Basophils Relative: 1 % (ref 0–1)
Eosinophils Absolute: 0.3 10*3/uL (ref 0.0–0.7)
Eosinophils Relative: 5 % (ref 0–5)
HCT: 38.4 % (ref 36.0–46.0)
Hemoglobin: 12.8 g/dL (ref 12.0–15.0)
LYMPHS ABS: 1.9 10*3/uL (ref 0.7–4.0)
Lymphocytes Relative: 34 % (ref 12–46)
MCH: 27.6 pg (ref 26.0–34.0)
MCHC: 33.3 g/dL (ref 30.0–36.0)
MCV: 82.9 fL (ref 78.0–100.0)
Monocytes Absolute: 0.4 10*3/uL (ref 0.1–1.0)
Monocytes Relative: 8 % (ref 3–12)
NEUTROS ABS: 2.9 10*3/uL (ref 1.7–7.7)
NEUTROS PCT: 52 % (ref 43–77)
Platelets: 294 10*3/uL (ref 150–400)
RBC: 4.63 MIL/uL (ref 3.87–5.11)
RDW: 14.3 % (ref 11.5–15.5)
WBC: 5.6 10*3/uL (ref 4.0–10.5)

## 2014-04-04 LAB — BASIC METABOLIC PANEL WITH GFR
BUN: 17 mg/dL (ref 6–23)
CO2: 28 mEq/L (ref 19–32)
Calcium: 9.1 mg/dL (ref 8.4–10.5)
Chloride: 102 mEq/L (ref 96–112)
Creat: 0.68 mg/dL (ref 0.50–1.10)
GFR, Est African American: >89 mL/min
GFR, Est Non African American: >89 mL/min
GLUCOSE: 83 mg/dL (ref 70–99)
Potassium: 4.2 mEq/L (ref 3.5–5.3)
SODIUM: 137 meq/L (ref 135–145)

## 2014-04-04 LAB — TSH: TSH: 1.801 u[IU]/mL (ref 0.350–4.500)

## 2014-04-04 LAB — VITAMIN B12: VITAMIN B 12: 365 pg/mL (ref 211–911)

## 2014-04-04 LAB — MAGNESIUM: MAGNESIUM: 2 mg/dL (ref 1.5–2.5)

## 2014-04-04 NOTE — Progress Notes (Signed)
Subjective:    Patient ID: Gabriela Ramirez, female    DOB: 10/26/1966, 48 y.o.   MRN: [id number]  HPI Comments: 48 yo WF 3 month f/u Cholesterol/ vitamin D. She has noticed mild weight gain and stomach bloating since ablation. She is eating healthy and trying to eat more veggies/ fruits. She notes mild constipation. She is exercising routinely 2-6 days a week.. She did not continue vitamins AD. She notes stress level has improved.  CHOL         188   11/14/2013 HDL           66   11/14/2013 LDLCALC      110   11/14/2013 TRIG          58   11/14/2013 CHOLHDL      2.8   11/14/2013 ALT           20   11/14/2013 AST           29   11/14/2013 ALKPHOS       52   11/14/2013 BILITOT      0.6   11/14/2013      Medication List       This list is accurate as of: 04/04/14 10:48 AM.  Always use your most recent med list.               MULTIVITAMIN PO  Take by mouth daily.       No Known Allergies  Past Medical History  Diagnosis Date  . Basal cell carcinoma     multiple  . Elevated cholesterol   . HPV (human papilloma virus) anogenital infection 2010  . Fibrocystic breast   . Sebaceous cyst     multiple  . Anxiety   . Chest pain   . Anemia      Review of Systems  Constitutional: Positive for fatigue and unexpected weight change.  Gastrointestinal: Positive for constipation.  All other systems reviewed and are negative.  BP 118/62  Pulse 58  Temp(Src) 98.6 F (37 C) (Temporal)  Resp 16  Ht 5\' 5"  (1.651 m)  Wt 148 lb (67.132 kg)  BMI 24.63 kg/m2  LMP 04/04/2014     Objective:   Physical Exam  Nursing note and vitals reviewed. Constitutional: She is oriented to person, place, and time. She appears well-developed and well-nourished. No distress.  HENT:  Head: Normocephalic and atraumatic.  Right Ear: External ear normal.  Left Ear: External ear normal.  Nose: Nose normal.  Mouth/Throat: Oropharynx is clear and moist. No oropharyngeal exudate.  Eyes: Conjunctivae and  EOM are normal.  Neck: Normal range of motion. Neck supple. No JVD present. No thyromegaly present.  Cardiovascular: Normal rate, regular rhythm, normal heart sounds and intact distal pulses.   Pulmonary/Chest: Effort normal and breath sounds normal.  Abdominal: Soft. Bowel sounds are normal. She exhibits no distension and no mass. There is no tenderness. There is no rebound and no guarding.  Musculoskeletal: Normal range of motion. She exhibits no edema and no tenderness.  Lymphadenopathy:    She has no cervical adenopathy.  Neurological: She is alert and oriented to person, place, and time. No cranial nerve deficit.  Skin: Skin is warm and dry. No rash noted. No erythema. No pallor.  Psychiatric: She has a normal mood and affect. Her behavior is normal. Judgment and thought content normal.          Assessment & Plan:  1. Cholesterol- recheck labs, Need to eat  healthier and exercise AD.  2. Vit D low add 5000 IU, may help with energy/ immune system improvement. Check labs  3. Weight gain vs pre-menopause vs age vs Fatigue vs constipation- check labs, increase activity and H2O, add 250 mg Magnesium

## 2014-04-04 NOTE — Patient Instructions (Signed)
Constipation, Adult Constipation is when a person:  Poops (bowel movement) less than 3 times a week.  Has a hard time pooping.  Has poop that is dry, hard, or bigger than normal. HOME CARE   Eat more fiber, such as fruits, vegetables, whole grains like brown rice, and beans.  Eat less fatty foods and sugar. This includes [place] fries, hamburgers, cookies, candy, and soda.  If you are not getting enough fiber from food, take products with added fiber in them (supplements).  Drink enough fluid to keep your pee (urine) clear or pale yellow.  Go to the restroom when you feel like you need to poop. Do not hold it.  Only take medicine as told by your doctor. Do not take medicines that help you poop (laxatives) without talking to your doctor first.  Exercise on a regular basis, or as told by your doctor. GET HELP RIGHT AWAY IF:   You have bright red blood in your poop (stool).  Your constipation lasts more than 4 days or gets worse.  You have belly (abdomen) or butt (rectal) pain.  You have thin poop (as thin as a pencil).  You lose weight, and it cannot be explained. MAKE SURE YOU:   Understand these instructions.  Will watch your condition.  Will get help right away if you are not doing well or get worse. Document Released: 05/19/2008 Document Revised: 02/23/2012 Document Reviewed: 09/12/2013 [facility] Patient Information 2014 [place], [place].   Bad carbs also include fruit juice, alcohol, and sweet tea. These are empty calories that do not signal to your brain that you are full.   Please remember the good carbs are still carbs which convert into sugar. So please measure them out no more than 1/2-1 cup of rice, oatmeal, pasta, and beans.  Veggies are however free foods! Pile them on.   I like lean protein at every meal such as chicken, [place], pork chops, cottage cheese, etc. Just do not fry these meats and please center your meal around vegetable, the meats should be a  side dish.   No all fruit is created equal. Please see the list below, the fruit at the bottom is higher in sugars than the fruit at the top

## 2014-04-05 LAB — VITAMIN D 25 HYDROXY (VIT D DEFICIENCY, FRACTURES): Vit D, 25-Hydroxy: 36 ng/mL (ref 30–89)

## 2014-07-24 IMAGING — US US TRANSVAGINAL NON-OB
1 series · 13 of 25 positions shown · non-contrast
Comparison: None.

CLINICAL DATA: Fibroids.  Anemia.  Abnormal uterine bleeding.  LMP
04/19/2013



[Series 1: us pelvis complete · 66 acquisitions, 13 frames shown]
[im 1/66]
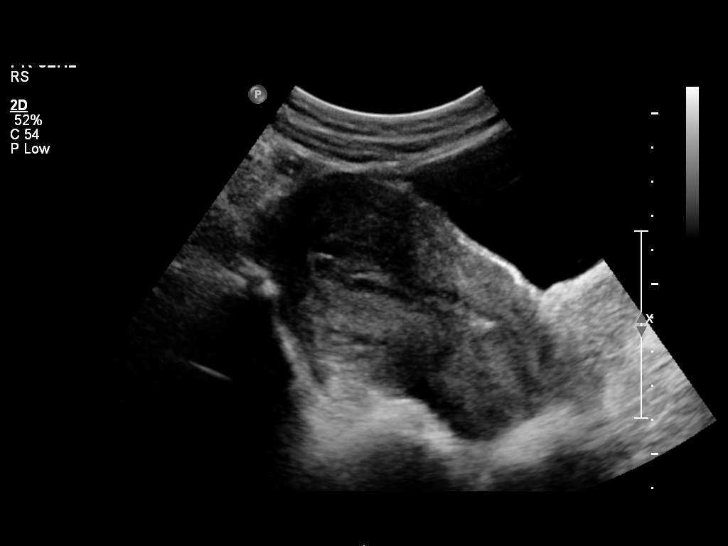
[im 6/66]
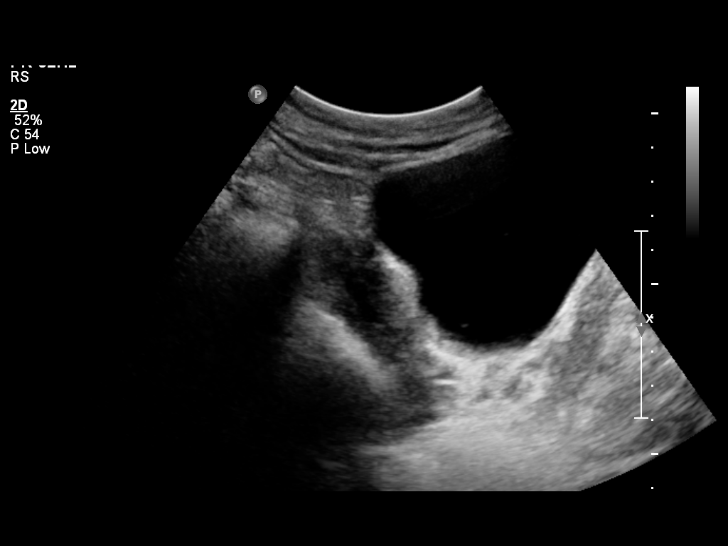
[im 11/66]
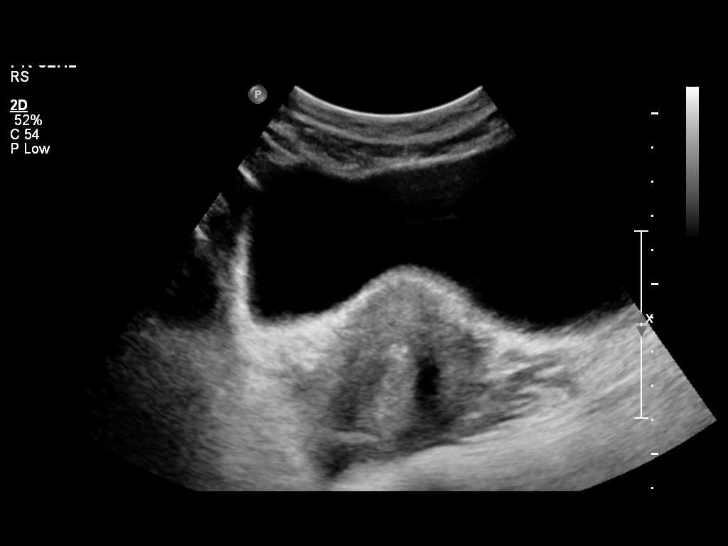
[im 17/66]
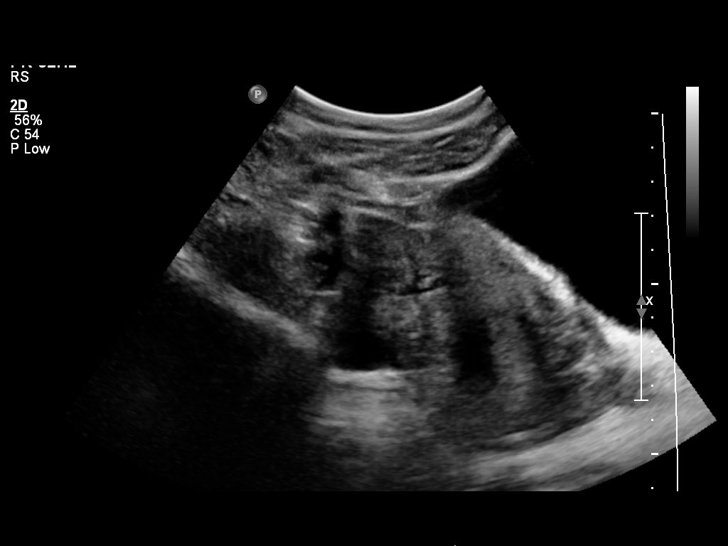
[im 22/66]
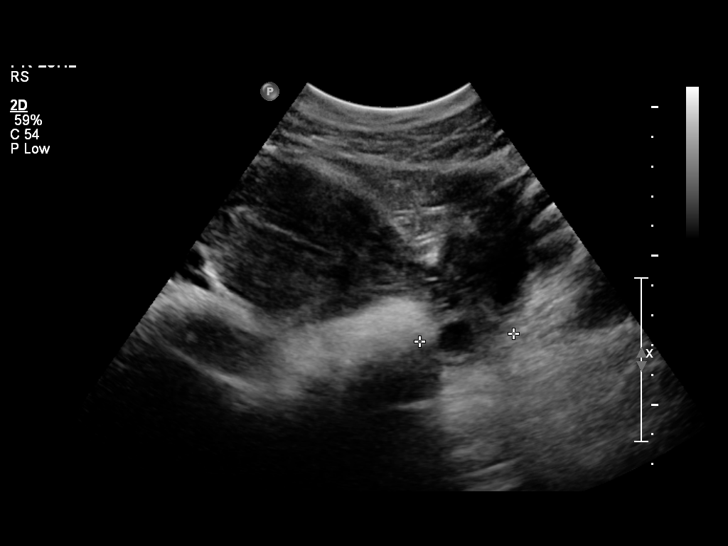
[im 28/66]
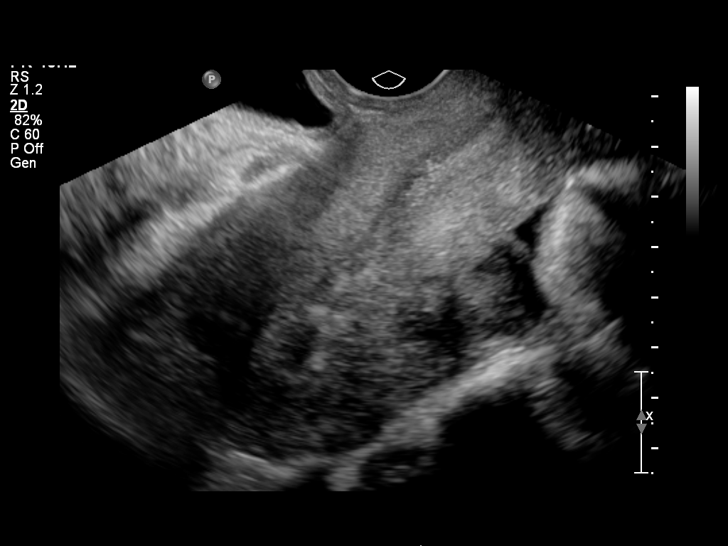
[im 33/66]
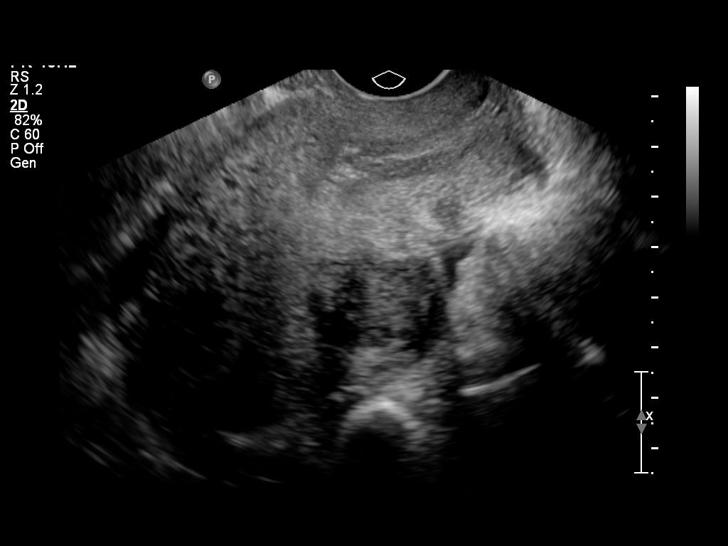
[im 38/66]
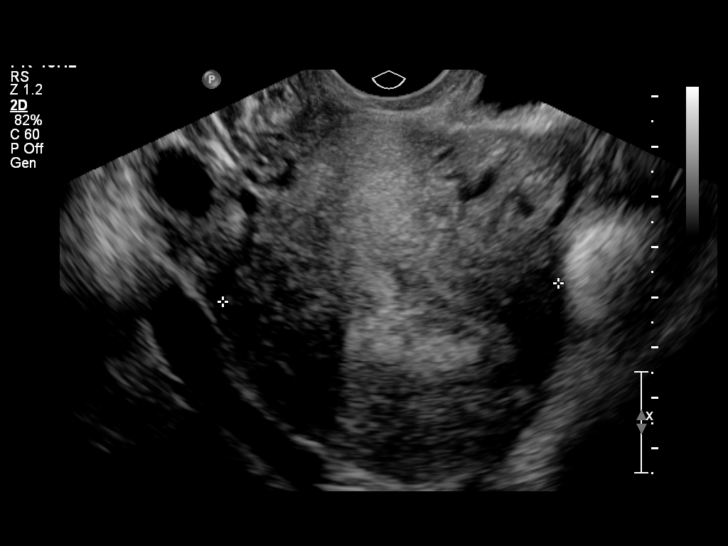
[im 44/66]
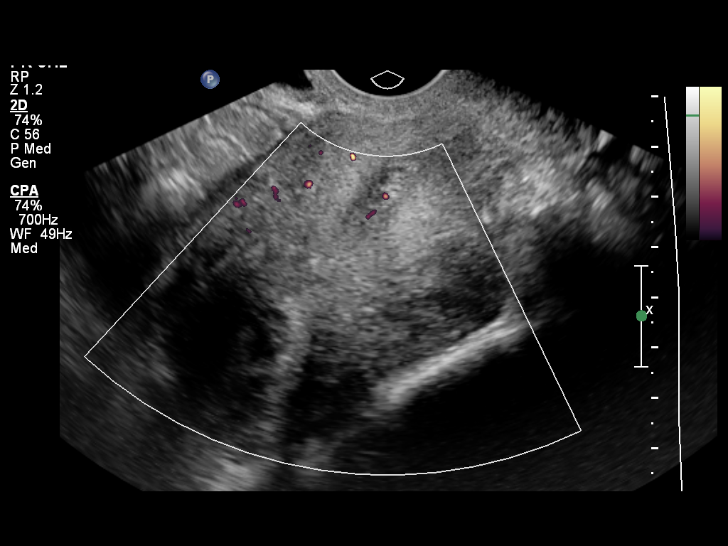
[im 49/66]
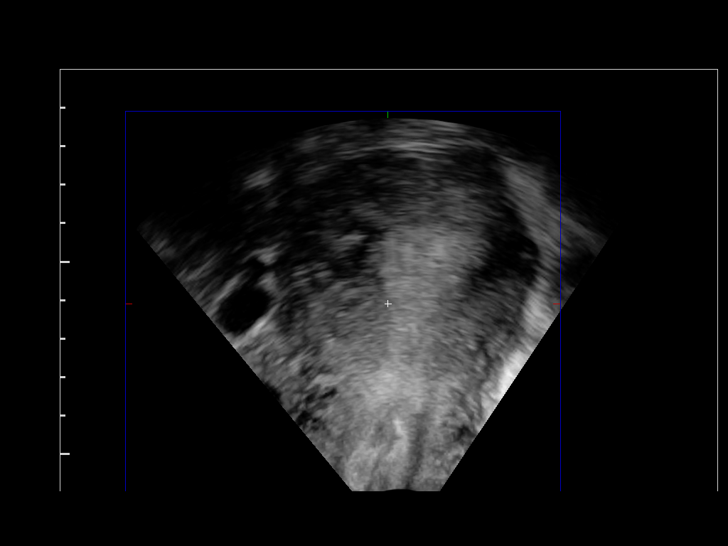
[im 55/66]
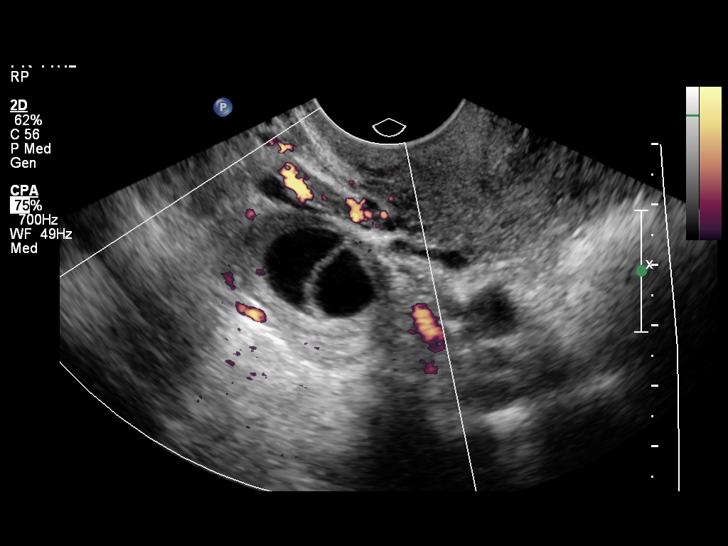
[im 60/66]
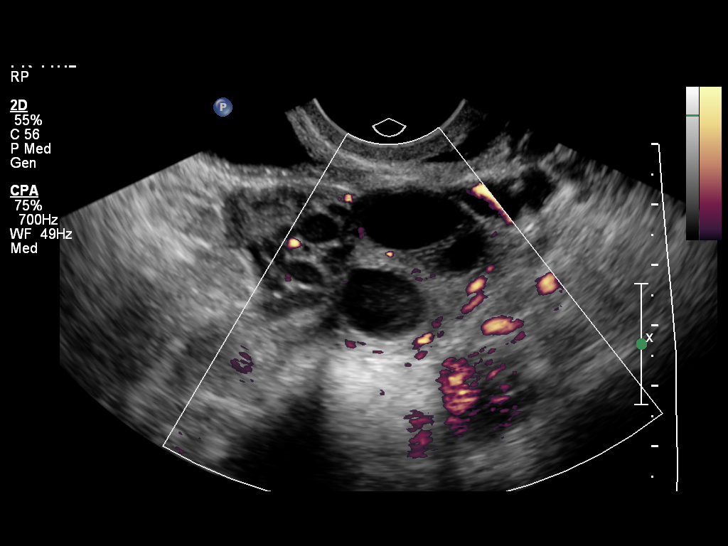
[im 66/66]
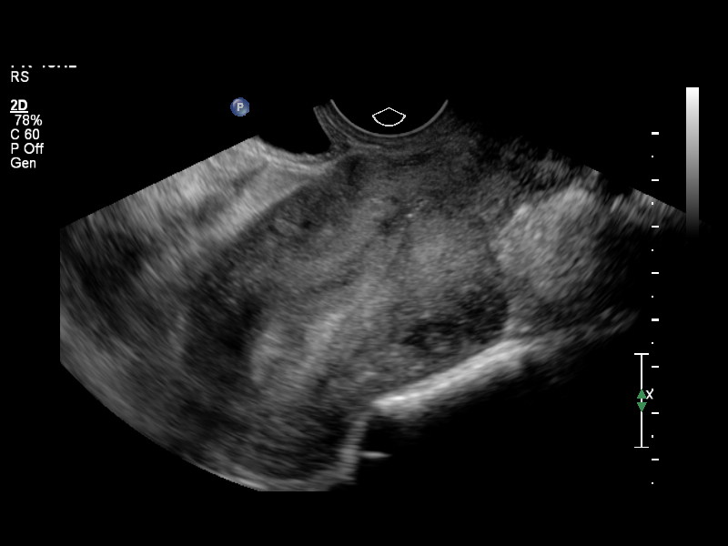

[13 of 25 positions shown; findings below may reference images not displayed]

FINDINGS: Uterus:  10.5 x 6.4 x 6.7 cm.  A subserosal fibroid is seen in the
posterior corpus which measures 2.5 x 2.3 x 3.0 cm.  An additional
fibroid is seen in the right anterior corpus which shows a partial
submucosal component indenting the endometrial stripe.  This
fibroid measures approximately 2.3 by 1.9 x 2.0 cm.

Endometrium: Maximum double layer thickness measures 12 mm.

Right ovary: 3.3 x 2.1 x 2.5 cm.  Normal appearance.  No adnexal
mass identified.

Left ovary: 3.3 x 2.4 x 2.9 cm.  Normal appearance.  No adnexal
mass identified.

Other Findings:  No free fluid
IMPRESSION: 1. Small fibroids, with one in the right anterior corpus which is
submucosal in location.  Endometrial thickness measures 12 mm.
2.  Normal ovaries.  No adnexal mass identified.

## 2014-10-19 ENCOUNTER — Ambulatory Visit: Payer: Self-pay | Admitting: Emergency Medicine

## 2014-11-14 ENCOUNTER — Encounter: Payer: Self-pay | Admitting: Emergency Medicine

## 2014-12-14 ENCOUNTER — Telehealth: Payer: Self-pay | Admitting: Internal Medicine

## 2014-12-14 NOTE — Telephone Encounter (Signed)
Called patient 11-03-14 and 11-29-14  to resch CPE with MSmith from 11-2014. 2nd messages.  Thank you, Katrina Judeth Horn [facility] [phone] Fax [phone]

## 2015-04-09 ENCOUNTER — Other Ambulatory Visit: Payer: Self-pay | Admitting: Dermatology

## 2015-04-17 ENCOUNTER — Ambulatory Visit (INDEPENDENT_AMBULATORY_CARE_PROVIDER_SITE_OTHER): Payer: 59 | Admitting: *Deleted

## 2015-04-17 DIAGNOSIS — R8290 Unspecified abnormal findings in urine: Secondary | ICD-10-CM

## 2015-04-17 DIAGNOSIS — R300 Dysuria: Secondary | ICD-10-CM

## 2015-04-17 MED ORDER — CIPROFLOXACIN HCL 500 MG PO TABS: 500.0000 mg | ORAL_TABLET | Freq: Every day | ORAL | Status: AC

## 2015-04-17 NOTE — Progress Notes (Signed)
Patient ID: Gabriela Ramirez, female   DOB: Sep 21, 1966, 49 y.o.   MRN: [id number] Patient presents with c/o dysuria and increasing urine odor.  Cipro abx will be sent in to offer some relief for patient until UC completed.  Advised patient we may need to change abx depending on UC or possibly may need ov for Wet Prep.

## 2015-04-18 LAB — URINALYSIS, ROUTINE W REFLEX MICROSCOPIC
Bilirubin Urine: NEGATIVE
GLUCOSE, UA: NEGATIVE mg/dL
Hgb urine dipstick: NEGATIVE
KETONES UR: NEGATIVE mg/dL
LEUKOCYTES UA: NEGATIVE
Nitrite: NEGATIVE
PROTEIN: NEGATIVE mg/dL
Specific Gravity, Urine: 1.007 (ref 1.005–1.030)
UROBILINOGEN UA: 0.2 mg/dL (ref 0.0–1.0)
pH: 6.5 (ref 5.0–8.0)

## 2015-04-19 LAB — URINE CULTURE: Colony Count: >=100000

## 2015-05-01 ENCOUNTER — Encounter: Payer: Self-pay | Admitting: Internal Medicine

## 2015-05-01 ENCOUNTER — Ambulatory Visit (INDEPENDENT_AMBULATORY_CARE_PROVIDER_SITE_OTHER): Payer: 59 | Admitting: Internal Medicine

## 2015-05-01 VITALS — BP 104/62 | HR 54 | Temp 98.0°F | Resp 18 | Ht 65.25 in | Wt 144.0 lb

## 2015-05-01 DIAGNOSIS — Z0000 Encounter for general adult medical examination without abnormal findings: Secondary | ICD-10-CM

## 2015-05-01 DIAGNOSIS — Z1329 Encounter for screening for other suspected endocrine disorder: Secondary | ICD-10-CM

## 2015-05-01 DIAGNOSIS — Z1389 Encounter for screening for other disorder: Secondary | ICD-10-CM

## 2015-05-01 DIAGNOSIS — Z131 Encounter for screening for diabetes mellitus: Secondary | ICD-10-CM

## 2015-05-01 DIAGNOSIS — Z79899 Other long term (current) drug therapy: Secondary | ICD-10-CM

## 2015-05-01 DIAGNOSIS — I10 Essential (primary) hypertension: Secondary | ICD-10-CM

## 2015-05-01 DIAGNOSIS — E782 Mixed hyperlipidemia: Secondary | ICD-10-CM

## 2015-05-01 DIAGNOSIS — Z1212 Encounter for screening for malignant neoplasm of rectum: Secondary | ICD-10-CM

## 2015-05-01 DIAGNOSIS — D649 Anemia, unspecified: Secondary | ICD-10-CM

## 2015-05-01 DIAGNOSIS — E559 Vitamin D deficiency, unspecified: Secondary | ICD-10-CM

## 2015-05-01 LAB — CBC WITH DIFFERENTIAL/PLATELET
Basophils Absolute: 0.1 10*3/uL (ref 0.0–0.1)
Basophils Relative: 1 % (ref 0–1)
EOS PCT: 2 % (ref 0–5)
Eosinophils Absolute: 0.1 10*3/uL (ref 0.0–0.7)
HCT: 39 % (ref 36.0–46.0)
Hemoglobin: 12.9 g/dL (ref 12.0–15.0)
Lymphocytes Relative: 33 % (ref 12–46)
Lymphs Abs: 2.3 10*3/uL (ref 0.7–4.0)
MCH: 28.5 pg (ref 26.0–34.0)
MCHC: 33.1 g/dL (ref 30.0–36.0)
MCV: 86.1 fL (ref 78.0–100.0)
MONO ABS: 0.5 10*3/uL (ref 0.1–1.0)
MPV: 10 fL (ref 8.6–12.4)
Monocytes Relative: 7 % (ref 3–12)
NEUTROS ABS: 4 10*3/uL (ref 1.7–7.7)
Neutrophils Relative %: 57 % (ref 43–77)
Platelets: 274 10*3/uL (ref 150–400)
RBC: 4.53 MIL/uL (ref 3.87–5.11)
RDW: 13.3 % (ref 11.5–15.5)
WBC: 7.1 10*3/uL (ref 4.0–10.5)

## 2015-05-01 LAB — HEMOGLOBIN A1C
Hgb A1c MFr Bld: 5.4 % (ref <5.7)
Mean Plasma Glucose: 108 mg/dL (ref <117)

## 2015-05-01 NOTE — Patient Instructions (Signed)

## 2015-05-01 NOTE — Progress Notes (Signed)
Patient ID: Gabriela Ramirez, female   DOB: [birth year], 49 y.o.   MRN: [id number]  Complete Physical   1. Anemia, unspecified anemia type  - Iron and TIBC - Vitamin B12  2. Mixed hyperlipidemia  - Lipid panel - EKG 12-Lead  3. Screening for diabetes mellitus  - Hemoglobin A1c - Insulin, random  4. Screening for hypothyroidism  - TSH  5. Screening for rectal cancer  - POC Hemoccult Bld/Stl (3-Cd Home Screen); Future  6. Screening for hematuria or proteinuria  - Urinalysis, Routine w reflex microscopic - Microalbumin / creatinine urine ratio  7. Medication management  - CBC with Differential/Platelet - BASIC METABOLIC PANEL WITH GFR - Hepatic function panel - Magnesium  8. Vitamin D deficiency -encouraged supplement - Vit D  25 hydroxy (rtn osteoporosis monitoring)    Assessment and Plan:  Discussed med's effects and SE's. Screening labs and tests as requested with regular follow-up as recommended.  HPI  49 y.o. female  presents for a complete physical.  Patient has history of anemia and also hyperlipidemia which is controlled on diet and exercise alone.    She is on cholesterol medication and denies myalgias. Her cholesterol is at goal. The cholesterol last visit was:  Lab Results  Component Value Date   CHOL 176 04/04/2014   HDL 67 04/04/2014   LDLCALC 96 04/04/2014   TRIG 63 04/04/2014   CHOLHDL 2.6 04/04/2014  .  Patient is on Vitamin D supplement.   Lab Results  Component Value Date   VD25OH 36 04/04/2014     She does see Dr. Cletis Media.  She is due for mammogram and also pap smear.   She does have yearly dermatology visits.    She is having right shoulder pain and has been ortho with Dr. Theda Sers and has likely rotator cuff issue and is having MRI done tomorrow.    Current Medications:  Current Outpatient Prescriptions on File Prior to Visit  Medication Sig Dispense Refill  . Multiple Vitamins-Minerals (MULTIVITAMIN PO) Take by mouth daily.      No current facility-administered medications on file prior to visit.    Health Maintenance:   Immunization History  Administered Date(s) Administered  . DTaP 09/14/2009  . PPD Test 11/14/2013  . Pneumococcal-Unspecified 01/16/1998  . Td 02/06/1995     Patient Care Team: Unk Pinto, MD as PCP - General (Internal Medicine)  Allergies: No Known Allergies  Medical History:  Past Medical History  Diagnosis Date  . Basal cell carcinoma     multiple  . Elevated cholesterol   . HPV (human papilloma virus) anogenital infection 2010  . Fibrocystic breast   . Sebaceous cyst     multiple  . Anxiety   . Chest pain   . Anemia   . Mixed hyperlipidemia 04/04/2014    Surgical History:  Past Surgical History  Procedure Laterality Date  . Basal cell carcinoma excision      multiple  . Dilitation & currettage/hystroscopy with versapoint resection N/A 07/29/2013    Procedure: DILATATION & CURETTAGE/HYSTEROSCOPY WITH VERSAPOINT RESECTION;  Surgeon: Alwyn Pea, MD;  Location: [facility];  Service: Gynecology;  Laterality: N/A;  Hysteroscopy, Versapoint Myomectomy, D&C, Novasure    . Novasure ablation N/A 07/29/2013    Procedure: NOVASURE ABLATION;  Surgeon: Alwyn Pea, MD;  Location: [facility];  Service: Gynecology;  Laterality: N/A;    Family History:  Family History  Problem Relation Age of Onset  . Adopted: Yes    Social History:  History  Substance Use Topics  . Smoking status: Never Smoker   . Smokeless tobacco: Not on file  . Alcohol Use: Yes     Comment: social    Review of Systems: Review of Systems  Constitutional: Negative for fever, chills and malaise/fatigue.  HENT: Negative for congestion, ear pain, nosebleeds and sore throat.   Eyes: Negative.   Respiratory: Negative for cough, shortness of breath and wheezing.   Cardiovascular: Negative for chest pain, palpitations and leg swelling.  Gastrointestinal: Negative for heartburn, nausea, vomiting,  diarrhea, constipation, blood in stool and melena.  Genitourinary: Negative for dysuria, urgency, frequency and hematuria.  Musculoskeletal: Positive for joint pain.  Skin: Negative.   Neurological: Negative for dizziness, sensory change, loss of consciousness and headaches.  Psychiatric/Behavioral: Negative for depression. The patient is not nervous/anxious and does not have insomnia.     Physical Exam: Estimated body mass index is 24.63 kg/(m^2) as calculated from the following:   Height as of 04/04/14: 5\' 5"  (1.651 m).   Weight as of 04/04/14: 148 lb (67.132 kg). There were no vitals taken for this visit.  General Appearance: Well nourished well developed, in no apparent distress.  Eyes: PERRLA, EOMs, conjunctiva no swelling or erythema ENT/Mouth: Ear canals normal without obstruction, swelling, erythema, or discharge.  TMs normal bilaterally with no erythema, bulging, retraction, or loss of landmark.  Oropharynx moist and clear with no exudate, erythema, or swelling.   Neck: Supple, thyroid normal. No bruits.  No cervical adenopathy Respiratory: Respiratory effort normal, Breath sounds clear A&P without wheeze, rhonchi, rales.   Cardio: RRR without murmurs, rubs or gallops. Brisk peripheral pulses without edema.  Chest: symmetric, with normal excursions Breasts: Symmetric, without lumps, nipple discharge, retractions.  Abdomen: Soft, nontender, no guarding, rebound, hernias, masses, or organomegaly.  Lymphatics: Non tender without lymphadenopathy.  Genitourinary:  Musculoskeletal: Full ROM all peripheral extremities,5/5 strength, and normal gait.  Skin: Warm, dry without rashes, lesions, ecchymosis. Neuro: Awake and oriented X 3, Cranial nerves intact, reflexes equal bilaterally. Normal muscle tone, no cerebellar symptoms. Sensation intact.  Psych:  normal affect, Insight and Judgment appropriate.   EKG: WNL no changes.  Over 40 minutes of exam, counseling, chart review and critical  decision making was performed  FORCUCCI, Kallen Mccrystal 2:04 PM [facility]

## 2015-05-02 LAB — BASIC METABOLIC PANEL WITH GFR
BUN: 15 mg/dL (ref 6–23)
CALCIUM: 8.9 mg/dL (ref 8.4–10.5)
CO2: 29 meq/L (ref 19–32)
Chloride: 104 mEq/L (ref 96–112)
Creat: 0.73 mg/dL (ref 0.50–1.10)
GFR, Est African American: >89 mL/min
GFR, Est Non African American: >89 mL/min
GLUCOSE: 85 mg/dL (ref 70–99)
Potassium: 3.8 mEq/L (ref 3.5–5.3)
Sodium: 139 mEq/L (ref 135–145)

## 2015-05-02 LAB — HEPATIC FUNCTION PANEL
ALBUMIN: 4.1 g/dL (ref 3.5–5.2)
ALT: 15 U/L (ref 0–35)
AST: 14 U/L (ref 0–37)
Alkaline Phosphatase: 55 U/L (ref 39–117)
BILIRUBIN TOTAL: 0.6 mg/dL (ref 0.2–1.2)
Bilirubin, Direct: 0.1 mg/dL (ref 0.0–0.3)
Indirect Bilirubin: 0.5 mg/dL (ref 0.2–1.2)
Total Protein: 6.8 g/dL (ref 6.0–8.3)

## 2015-05-02 LAB — URINALYSIS, ROUTINE W REFLEX MICROSCOPIC
Bilirubin Urine: NEGATIVE
Glucose, UA: NEGATIVE mg/dL
Hgb urine dipstick: NEGATIVE
Ketones, ur: NEGATIVE mg/dL
Leukocytes, UA: NEGATIVE
NITRITE: NEGATIVE
PH: 7.5 (ref 5.0–8.0)
Protein, ur: NEGATIVE mg/dL
Specific Gravity, Urine: <1.005 — ABNORMAL LOW (ref 1.005–1.030)
Urobilinogen, UA: 0.2 mg/dL (ref 0.0–1.0)

## 2015-05-02 LAB — LIPID PANEL
Cholesterol: 176 mg/dL (ref 0–200)
HDL: 67 mg/dL (ref >=46)
LDL CALC: 93 mg/dL (ref 0–99)
Total CHOL/HDL Ratio: 2.6 Ratio
Triglycerides: 78 mg/dL (ref <150)
VLDL: 16 mg/dL (ref 0–40)

## 2015-05-02 LAB — MICROALBUMIN / CREATININE URINE RATIO
Creatinine, Urine: 79.5 mg/dL
Microalb Creat Ratio: 2.5 mg/g (ref 0.0–30.0)
Microalb, Ur: 0.2 mg/dL (ref <2.0)

## 2015-05-02 LAB — IRON AND TIBC
%SAT: 39 % (ref 20–55)
IRON: 142 ug/dL (ref 42–145)
TIBC: 367 ug/dL (ref 250–470)
UIBC: 225 ug/dL (ref 125–400)

## 2015-05-02 LAB — INSULIN, RANDOM: Insulin: 6.6 u[IU]/mL (ref 2.0–19.6)

## 2015-05-02 LAB — MAGNESIUM: MAGNESIUM: 2.1 mg/dL (ref 1.5–2.5)

## 2015-05-02 LAB — TSH: TSH: 0.971 u[IU]/mL (ref 0.350–4.500)

## 2015-05-02 LAB — VITAMIN D 25 HYDROXY (VIT D DEFICIENCY, FRACTURES): VIT D 25 HYDROXY: 28 ng/mL — AB (ref 30–100)

## 2015-05-02 LAB — VITAMIN B12: Vitamin B-12: 396 pg/mL (ref 211–911)

## 2015-05-23 NOTE — Addendum Note (Signed)
Addended by: Starlyn Skeans A on: 05/23/2015 02:03 PM   Modules accepted: Miquel Dunn

## 2015-08-21 DIAGNOSIS — L989 Disorder of the skin and subcutaneous tissue, unspecified: Secondary | ICD-10-CM | POA: Insufficient documentation

## 2015-08-21 DIAGNOSIS — L720 Epidermal cyst: Secondary | ICD-10-CM | POA: Insufficient documentation

## 2016-04-01 ENCOUNTER — Encounter: Payer: Self-pay | Admitting: Physician Assistant

## 2016-04-01 ENCOUNTER — Ambulatory Visit (INDEPENDENT_AMBULATORY_CARE_PROVIDER_SITE_OTHER): Payer: 59 | Admitting: Physician Assistant

## 2016-04-01 VITALS — BP 122/82 | HR 67 | Temp 97.5°F | Resp 16 | Ht 62.5 in | Wt 143.0 lb

## 2016-04-01 DIAGNOSIS — L237 Allergic contact dermatitis due to plants, except food: Secondary | ICD-10-CM | POA: Diagnosis not present

## 2016-04-01 MED ORDER — DEXAMETHASONE SODIUM PHOSPHATE 100 MG/10ML IJ SOLN
10.0000 mg | Freq: Once | INTRAMUSCULAR | Status: AC
  Administered 2016-04-01: 10 mg via INTRAMUSCULAR

## 2016-04-01 NOTE — Progress Notes (Signed)
   Subjective:    Patient ID: Gabriela Ramirez, female    DOB: 05-May-1966, 50 y.o.   MRN: [id number]  HPI 50 y.o. WF presents with poison ivy. States moved to new house, started to pull weeds on Friday and Saturday woke up with itchy vesicles, they are spreading. She [facility] but stopped [facility].   Blood pressure 122/82, pulse 67, temperature 97.5 F (36.4 C), temperature source Temporal, resp. rate 16, height 5' 2.5" (1.588 m), weight 143 lb (64.864 kg), SpO2 98 %.  Past Medical History  Diagnosis Date  . Basal cell carcinoma     multiple  . Elevated cholesterol   . HPV (human papilloma virus) anogenital infection 2010  . Fibrocystic breast   . Sebaceous cyst     multiple  . Anxiety   . Chest pain   . Anemia   . Mixed hyperlipidemia 04/04/2014   No current outpatient prescriptions on file prior to visit.   No current facility-administered medications on file prior to visit.   Review of Systems  Constitutional: Negative.  Negative for fever and chills.  HENT: Negative.   Respiratory: Negative.   Cardiovascular: Negative.   Gastrointestinal: Negative.  Negative for diarrhea.  Genitourinary: Negative.   Musculoskeletal: Negative.  Negative for arthralgias.  Skin: Positive for rash. Negative for color change and wound.  Neurological: Negative.  Negative for dizziness.       Objective:   Physical Exam  Constitutional: She appears well-developed and well-nourished.  Eyes: Conjunctivae are normal. Pupils are equal, round, and reactive to light.  Neck: Normal range of motion. Neck supple.  Cardiovascular: Normal rate and regular rhythm.   Pulmonary/Chest: Effort normal and breath sounds normal.  Abdominal: Soft. Bowel sounds are normal.  Skin:  On bilateral legs, linear erythematous vesicles. No warmth, surrounding erythema, discharge.        Assessment & Plan:  1. Poison oak dermatitis - dexamethasone (DECADRON) injection 10 mg; Inject 1  mL (10 mg total) into the muscle once.

## 2016-04-01 NOTE — Patient Instructions (Signed)
Please take the prednisone to help decrease inflammation and therefore decrease symptoms. Take it it with food to avoid GI upset. It can cause increased energy but on the other hand it can make it hard to sleep at night so please take it AT [place], it takes 8-12 hours to start working so it will NOT affect your sleeping if you take it at night with your food!!  If you are diabetic it will increase your sugars so decrease carbs and monitor your sugars closely.    Can do 4 prednisone at night, 3 , then 2 if you want  Poison [facility] ivy is a inflammation of the skin (contact dermatitis) caused by touching the allergens on the leaves of the ivy plant following previous exposure to the plant. The rash usually appears 48 hours after exposure. The rash is usually bumps (papules) or blisters (vesicles) in a linear pattern. Depending on your own sensitivity, the rash may simply cause redness and itching, or it may also progress to blisters which may break open. These must be well cared for to prevent secondary bacterial (germ) infection, followed by scarring. Keep any open areas dry, clean, dressed, and covered with an antibacterial ointment if needed. The eyes may also get puffy. The puffiness is worst in the morning and gets better as the day progresses. This dermatitis usually heals without scarring, within 2 to 3 weeks without treatment. HOME CARE INSTRUCTIONS  Thoroughly wash with soap and water as soon as you have been exposed to poison ivy. You have about one half hour to remove the plant resin before it will cause the rash. This washing will destroy the oil or antigen on the skin that is causing, or will cause, the rash. Be sure to wash under your fingernails as any plant resin there will continue to spread the rash. Do not rub skin vigorously when washing affected area. Poison ivy cannot spread if no oil from the plant remains on your body. A rash that has progressed to weeping sores will not  spread the rash unless you have not washed thoroughly. It is also important to wash any clothes you have been wearing as these may carry active allergens. The rash will return if you wear the unwashed clothing, even several days later. Avoidance of the plant in the future is the best measure. Poison ivy plant can be recognized by the number of leaves. Generally, poison ivy has three leaves with flowering branches on a single stem. Diphenhydramine may be purchased over the counter and used as needed for itching. Do not drive with this medication if it makes you drowsy.Ask your caregiver about medication for children. SEEK MEDICAL CARE IF:  Open sores develop.  Redness spreads beyond area of rash.  You notice purulent (pus-like) discharge.  You have increased pain.  Other signs of infection develop (such as fever).   This information is not intended to replace advice given to you by your health care provider. Make sure you discuss any questions you have with your health care provider.   Document Released: 11/28/2000 Document Revised: 02/23/2012 Document Reviewed: 05/09/2015 Elsevier Interactive Patient Education [employer].

## 2016-04-07 ENCOUNTER — Other Ambulatory Visit: Payer: Self-pay | Admitting: Physician Assistant

## 2016-04-07 MED ORDER — DEXAMETHASONE 0.5 MG PO TABS: ORAL_TABLET | ORAL | Status: DC

## 2016-04-09 ENCOUNTER — Other Ambulatory Visit: Payer: Self-pay | Admitting: Internal Medicine

## 2016-04-09 MED ORDER — TRIAMCINOLONE ACETONIDE 0.1 % EX CREA: 1.0000 "application " | TOPICAL_CREAM | Freq: Three times a day (TID) | CUTANEOUS | Status: DC

## 2016-04-30 ENCOUNTER — Encounter: Payer: Self-pay | Admitting: Internal Medicine

## 2016-05-07 ENCOUNTER — Ambulatory Visit (INDEPENDENT_AMBULATORY_CARE_PROVIDER_SITE_OTHER): Payer: 59 | Admitting: Internal Medicine

## 2016-05-07 ENCOUNTER — Encounter: Payer: Self-pay | Admitting: Internal Medicine

## 2016-05-07 VITALS — BP 108/64 | HR 60 | Temp 97.3°F | Resp 16 | Ht 65.0 in | Wt 141.4 lb

## 2016-05-07 DIAGNOSIS — E782 Mixed hyperlipidemia: Secondary | ICD-10-CM

## 2016-05-07 DIAGNOSIS — I10 Essential (primary) hypertension: Secondary | ICD-10-CM

## 2016-05-07 DIAGNOSIS — Z0000 Encounter for general adult medical examination without abnormal findings: Secondary | ICD-10-CM | POA: Diagnosis not present

## 2016-05-07 DIAGNOSIS — Z131 Encounter for screening for diabetes mellitus: Secondary | ICD-10-CM

## 2016-05-07 DIAGNOSIS — Z136 Encounter for screening for cardiovascular disorders: Secondary | ICD-10-CM | POA: Diagnosis not present

## 2016-05-07 DIAGNOSIS — Z130 Encounter for screening for diseases of the blood and blood-forming organs and certain disorders involving the immune mechanism: Secondary | ICD-10-CM

## 2016-05-07 DIAGNOSIS — Z1389 Encounter for screening for other disorder: Secondary | ICD-10-CM

## 2016-05-07 DIAGNOSIS — Z1329 Encounter for screening for other suspected endocrine disorder: Secondary | ICD-10-CM

## 2016-05-07 DIAGNOSIS — E559 Vitamin D deficiency, unspecified: Secondary | ICD-10-CM

## 2016-05-07 LAB — CBC WITH DIFFERENTIAL/PLATELET
BASOS ABS: 0 {cells}/uL (ref 0–200)
Basophils Relative: 0 %
EOS ABS: 136 {cells}/uL (ref 15–500)
Eosinophils Relative: 2 %
HCT: 41.1 % (ref 35.0–45.0)
HEMOGLOBIN: 13.5 g/dL (ref 11.7–15.5)
LYMPHS ABS: 2312 {cells}/uL (ref 850–3900)
Lymphocytes Relative: 34 %
MCH: 28.8 pg (ref 27.0–33.0)
MCHC: 32.8 g/dL (ref 32.0–36.0)
MCV: 87.6 fL (ref 80.0–100.0)
MONOS PCT: 6 %
MPV: 9.9 fL (ref 7.5–12.5)
Monocytes Absolute: 408 cells/uL (ref 200–950)
NEUTROS ABS: 3944 {cells}/uL (ref 1500–7800)
NEUTROS PCT: 58 %
Platelets: 301 10*3/uL (ref 140–400)
RBC: 4.69 MIL/uL (ref 3.80–5.10)
RDW: 13.3 % (ref 11.0–15.0)
WBC: 6.8 10*3/uL (ref 3.8–10.8)

## 2016-05-07 LAB — HEMOGLOBIN A1C
HEMOGLOBIN A1C: 5.3 % (ref <5.7)
Mean Plasma Glucose: 105 mg/dL

## 2016-05-07 MED ORDER — CLOTRIMAZOLE-BETAMETHASONE 1-0.05 % EX CREA: TOPICAL_CREAM | CUTANEOUS | Status: AC

## 2016-05-07 NOTE — Progress Notes (Signed)
Annual Screening Comprehensive Examination   This very nice 50 y.o.female presents for complete physical.  Patient has no major health issues at this time.  She reports that she recently recovered from a bout of poison ivy.  She does follow with dermatology yearly for skin check.  Patient reports no complaints at this time.   Finally, patient has history of Vitamin D Deficiency and last vitamin D was  Lab Results  Component Value Date   VD25OH 28* 05/01/2015  .  Currently on supplementation  She is taking yaz.  She reports that she is having regular periods.  She is seeing obgyn currently.  They have been handling paps and mammograms.  No change in family history.    She reports that she works out twice weekly with a trainer and does try to get cardio in as well.     Current Outpatient Prescriptions on File Prior to Visit  Medication Sig Dispense Refill  . YAZ 3-0.02 MG tablet      No current facility-administered medications on file prior to visit.    No Known Allergies  Past Medical History  Diagnosis Date  . Basal cell carcinoma     multiple  . Elevated cholesterol   . HPV (human papilloma virus) anogenital infection 2010  . Fibrocystic breast   . Sebaceous cyst     multiple  . Anxiety   . Chest pain   . Anemia   . Mixed hyperlipidemia 04/04/2014    Immunization History  Administered Date(s) Administered  . DTaP 09/14/2009  . PPD Test 11/14/2013  . Pneumococcal-Unspecified 01/16/1998  . Td 02/06/1995    Past Surgical History  Procedure Laterality Date  . Basal cell carcinoma excision      multiple  . Dilitation & currettage/hystroscopy with versapoint resection N/A 07/29/2013    Procedure: DILATATION & CURETTAGE/HYSTEROSCOPY WITH VERSAPOINT RESECTION;  Surgeon: Alwyn Pea, MD;  Location: [facility];  Service: Gynecology;  Laterality: N/A;  Hysteroscopy, Versapoint Myomectomy, D&C, Novasure    . Novasure ablation N/A 07/29/2013    Procedure: NOVASURE  ABLATION;  Surgeon: Alwyn Pea, MD;  Location: [facility];  Service: Gynecology;  Laterality: N/A;    Family History  Problem Relation Age of Onset  . Adopted: Yes    Social History   Social History  . Marital Status: Married    Spouse Name: N/A  . Number of Children: 2  . Years of Education: N/A   Occupational History  . staffing firm manager    Social History Main Topics  . Smoking status: Never Smoker   . Smokeless tobacco: Not on file  . Alcohol Use: Yes     Comment: social  . Drug Use: No  . Sexual Activity: Not on file   Other Topics Concern  . Not on file   Social History Narrative    Review of Systems  Constitutional: Negative for fever, chills and malaise/fatigue.  HENT: Negative for congestion, ear pain and sore throat.   Eyes: Negative.   Respiratory: Negative for cough, shortness of breath and wheezing.   Cardiovascular: Negative for chest pain, palpitations and leg swelling.  Gastrointestinal: Negative for heartburn, abdominal pain, diarrhea, constipation, blood in stool and melena.  Genitourinary: Positive for urgency. Negative for dysuria, frequency, hematuria and flank pain.  Skin: Negative.   Neurological: Negative for dizziness, sensory change, loss of consciousness and headaches.  Psychiatric/Behavioral: Negative for depression. The patient is not nervous/anxious and does not have insomnia.  Physical Exam  BP 108/64 mmHg  Pulse 60  Temp(Src) 97.3 F (36.3 C)  Resp 16  Ht 5\' 5"  (1.651 m)  Wt 141 lb 6.4 oz (64.139 kg)  BMI 23.53 kg/m2  General Appearance: Well nourished and in no apparent distress. Eyes: PERRLA, EOMs, conjunctiva no swelling or erythema, normal fundi and vessels. Sinuses: No frontal/maxillary tenderness ENT/Mouth: EACs patent / TMs  nl. Nares clear without erythema, swelling, mucoid exudates. Oral hygiene is good. No erythema, swelling, or exudate. Tongue normal, non-obstructing. Tonsils not swollen or erythematous.  Hearing normal.  Neck: Supple, thyroid normal. No bruits, nodes or JVD. Respiratory: Respiratory effort normal.  BS equal and clear bilateral without rales, rhonci, wheezing or stridor. Cardio: Heart sounds are normal with regular rate and rhythm and no murmurs, rubs or gallops. Peripheral pulses are normal and equal bilaterally without edema. No aortic or femoral bruits. Chest: symmetric with normal excursions and percussion. Breasts: Symmetric, without lumps, nipple discharge, retractions.  Fibrocystic changes thoughtout bilateral breasts.  Abdomen: Flat, soft, with bowl sounds. Nontender, no guarding, rebound, hernias, masses, or organomegaly.  Lymphatics: Non tender without lymphadenopathy.  Musculoskeletal: Full ROM all peripheral extremities, joint stability, 5/5 strength, and normal gait. Skin: Warm and dry without, lesions, cyanosis, clubbing or  ecchymosis. Mild bumpy erythematous papular rash in between breasts.  NO pustules.  No diffuse redness. Neuro: Cranial nerves intact, reflexes equal bilaterally. Normal muscle tone, no cerebellar symptoms. Sensation intact.  Pysch: Awake and oriented X 3, normal affect, Insight and Judgment appropriate.   Assessment and Plan   1. Routine general medical examination at a health care facility  - CBC with Differential/Platelet - BASIC METABOLIC PANEL WITH GFR - Hepatic function panel - Magnesium  2. Mixed hyperlipidemia  - Lipid panel  3. Screening for diabetes mellitus  - Hemoglobin A1c - Insulin, random  4. Screening for thyroid disorder  - TSH  5. Screening for deficiency anemia - Iron and TIBC - Vitamin B12  6. Screening for hematuria or proteinuria  - Urinalysis, Routine w reflex microscopic (not at [facility]) - Microalbumin / creatinine urine ratio  7. Screening for cardiovascular condition  - EKG 12-Lead - [place], RETROPERITNL ABD,  LTD  8. Vitamin D deficiency  - VITAMIN D 25 Hydroxy (Vit-D Deficiency,  Fractures)   9.  Folliculitis -try lortisone cream -follow-up with derm at yearly appointment.      Continue prudent diet as discussed, weight control, regular exercise, and medications. Routine screening labs and tests as requested with regular follow-up as recommended.  Over 40 minutes of exam, counseling, chart review and critical decision making was performed

## 2016-05-08 LAB — VITAMIN D 25 HYDROXY (VIT D DEFICIENCY, FRACTURES): Vit D, 25-Hydroxy: 35 ng/mL (ref 30–100)

## 2016-05-08 LAB — INSULIN, RANDOM: INSULIN: 4.6 u[IU]/mL (ref 2.0–19.6)

## 2016-05-08 LAB — URINALYSIS, ROUTINE W REFLEX MICROSCOPIC
Bilirubin Urine: NEGATIVE
Glucose, UA: NEGATIVE
HGB URINE DIPSTICK: NEGATIVE
Ketones, ur: NEGATIVE
LEUKOCYTES UA: NEGATIVE
NITRITE: NEGATIVE
PROTEIN: NEGATIVE
Specific Gravity, Urine: 1.016 (ref 1.001–1.035)
pH: 7 (ref 5.0–8.0)

## 2016-05-08 LAB — MICROALBUMIN / CREATININE URINE RATIO
Creatinine, Urine: 146 mg/dL (ref 20–320)
MICROALB UR: 1.4 mg/dL
MICROALB/CREAT RATIO: 10 ug/mg{creat} (ref <30)

## 2016-05-08 LAB — LIPID PANEL
Cholesterol: 222 mg/dL — ABNORMAL HIGH (ref 125–200)
HDL: 83 mg/dL (ref >=46)
LDL CALC: 117 mg/dL (ref <130)
TRIGLYCERIDES: 112 mg/dL (ref <150)
Total CHOL/HDL Ratio: 2.7 Ratio (ref <=5.0)
VLDL: 22 mg/dL (ref <30)

## 2016-05-08 LAB — HEPATIC FUNCTION PANEL
ALT: 13 U/L (ref 6–29)
AST: 16 U/L (ref 10–35)
Albumin: 4 g/dL (ref 3.6–5.1)
Alkaline Phosphatase: 39 U/L (ref 33–115)
Bilirubin, Direct: 0.1 mg/dL (ref <=0.2)
Indirect Bilirubin: 0.5 mg/dL (ref 0.2–1.2)
TOTAL PROTEIN: 7.1 g/dL (ref 6.1–8.1)
Total Bilirubin: 0.6 mg/dL (ref 0.2–1.2)

## 2016-05-08 LAB — IRON AND TIBC
%SAT: 32 % (ref 11–50)
Iron: 151 ug/dL (ref 40–190)
TIBC: 475 ug/dL — ABNORMAL HIGH (ref 250–450)
UIBC: 324 ug/dL (ref 125–400)

## 2016-05-08 LAB — BASIC METABOLIC PANEL WITH GFR
BUN: 13 mg/dL (ref 7–25)
CHLORIDE: 102 mmol/L (ref 98–110)
CO2: 23 mmol/L (ref 20–31)
CREATININE: 0.79 mg/dL (ref 0.50–1.10)
Calcium: 9.1 mg/dL (ref 8.6–10.2)
GFR, Est African American: >89 mL/min (ref >=60)
GFR, Est Non African American: 88 mL/min (ref >=60)
Glucose, Bld: 79 mg/dL (ref 65–99)
Potassium: 4.3 mmol/L (ref 3.5–5.3)
Sodium: 137 mmol/L (ref 135–146)

## 2016-05-08 LAB — MAGNESIUM: Magnesium: 2.1 mg/dL (ref 1.5–2.5)

## 2016-05-08 LAB — TSH: TSH: 1.64 mIU/L

## 2016-05-08 LAB — VITAMIN B12: VITAMIN B 12: 497 pg/mL (ref 200–1100)

## 2016-09-01 ENCOUNTER — Ambulatory Visit (INDEPENDENT_AMBULATORY_CARE_PROVIDER_SITE_OTHER): Payer: 59 | Admitting: Internal Medicine

## 2016-09-01 ENCOUNTER — Encounter: Payer: Self-pay | Admitting: Internal Medicine

## 2016-09-01 VITALS — BP 120/82 | HR 64 | Temp 97.5°F | Resp 16 | Ht 65.0 in | Wt 145.8 lb

## 2016-09-01 DIAGNOSIS — L500 Allergic urticaria: Secondary | ICD-10-CM | POA: Diagnosis not present

## 2016-09-01 MED ORDER — MONTELUKAST SODIUM 10 MG PO TABS: 10.0000 mg | ORAL_TABLET | Freq: Every day | ORAL | 2 refills | Status: DC

## 2016-09-01 NOTE — Progress Notes (Signed)
  Subjective:    Patient ID: Gabriela Ramirez, female    DOB: 1966-05-21, 50 y.o.   MRN: [id number]  HPI  This very nice 50 yo MWF with essentially neg PMH presents with a 2-2&1/2 week hx/o generalized hive-type eruption and was seen by Dr Malcolm Metro suspected Scabies and after scraping , patient was told it was negative and she must have "bedbugs" , so she has a Wellsite geologist which advised no bedbugs. 3 days ago , patient was started on a steroid taper and also note she has been on Zyrtec for 2 weeks for her itching sx's. Patient denies any tick bites, arthralgias, myalgias and systems review is negative otherwise.   Medication Sig  . clotrimazole-betamethasone (LOTRISONE) cream Apply to affected area 2 times daily  . YAZ 3-0.02 MG tablet    ASSESSMENT:ll Past Medical History:  Diagnosis Date  . Anemia   . Anxiety   . Basal cell carcinoma    multiple  . Chest pain   . Elevated cholesterol   . Fibrocystic breast   . HPV (human papilloma virus) anogenital infection 2010  . Mixed hyperlipidemia 04/04/2014  . Sebaceous cyst    multiple   Review of Systems  10 point systems review negative except as above.     Objective:   Physical Exam  BP 120/82   Pulse 64   Temp 97.5 F (36.4 C)   Resp 16   Ht 5\' 5"  (1.651 m)   Wt 145 lb 12.8 oz (66.1 kg)   BMI 24.26 kg/m   Skin reveals generalized diffuse Urticaria of varying size and numerous areas of excoriations. Mild dermographism is stimulated.     Assessment & Plan:   1. Allergic urticaria  - CBC with Differential/Platelet - Hepatic function panel - Sedimentation rate - Anti-DNA antibody, double-stranded - C3 and C4 - Cyclic citrul peptide antibody, IgG - C-reactive protein - Rheumatoid factor - CK - Complement, total - Rocky mtn spotted fvr abs pnl(IgG+IgM) - Lyme Aby, Wstrn. Blt. IgG & IgM w/bands]  - Empiric Rx- montelukast (SINGULAIR) 10 MG tablet; Take 1 tablet (10 mg total) by mouth daily.  Dispense: 30  tablet; Refill: 2  - consider allergy testing if above w/u is negative.

## 2016-09-01 NOTE — Patient Instructions (Addendum)
Hives Hives are itchy, red, swollen areas of the skin. They can vary in size and location on your body. Hives can come and go for hours or several days (acute hives) or for several weeks (chronic hives). Hives do not spread from person to person (noncontagious). They may get worse with scratching, exercise, and emotional stress. CAUSES   Allergic reaction to food, additives, or drugs.  Infections, including the common cold.  Illness, such as vasculitis, lupus, or thyroid disease.  Exposure to sunlight, heat, or cold.  Exercise.  Stress.  Contact with chemicals. SYMPTOMS   Red or white swollen patches on the skin. The patches may change size, shape, and location quickly and repeatedly.  Itching.  Swelling of the hands, feet, and face. This may occur if hives develop deeper in the skin. DIAGNOSIS  Your caregiver can usually tell what is wrong by performing a physical exam. Skin or blood tests may also be done to determine the cause of your hives. In some cases, the cause cannot be determined. TREATMENT  Mild cases usually get better with medicines such as antihistamines. Severe cases may require an emergency epinephrine injection. If the cause of your hives is known, treatment includes avoiding that trigger.  HOME CARE INSTRUCTIONS   Avoid causes that trigger your hives.  Take antihistamines as directed by your caregiver to reduce the severity of your hives. Non-sedating or low-sedating antihistamines are usually recommended. Do not drive while taking an antihistamine.  Take any other medicines prescribed for itching as directed by your caregiver.  Wear loose-fitting clothing.  Keep all follow-up appointments as directed by your caregiver. SEEK MEDICAL CARE IF:   You have persistent or severe itching that is not relieved with medicine.  You have painful or swollen joints. SEEK IMMEDIATE MEDICAL CARE IF:   You have a fever.  Your tongue or lips are swollen.  You have  trouble breathing or swallowing.  You feel tightness in the throat or chest.  You have abdominal pain. These problems may be the first sign of a life-threatening allergic reaction. Call your local emergency services (911 in [place] MAKE SURE YOU:   Understand these instructions.  Will watch your condition.  Will get help right away if you are not doing well or get worse.   Marland Kitchen

## 2016-09-02 LAB — HEPATIC FUNCTION PANEL
ALBUMIN: 4.4 g/dL (ref 3.6–5.1)
ALT: 15 U/L (ref 6–29)
AST: 16 U/L (ref 10–35)
Alkaline Phosphatase: 45 U/L (ref 33–115)
BILIRUBIN DIRECT: 0.1 mg/dL (ref <=0.2)
BILIRUBIN TOTAL: 0.3 mg/dL (ref 0.2–1.2)
Indirect Bilirubin: 0.2 mg/dL (ref 0.2–1.2)
Total Protein: 7.6 g/dL (ref 6.1–8.1)

## 2016-09-02 LAB — CBC WITH DIFFERENTIAL/PLATELET
Basophils Absolute: 0 cells/uL (ref 0–200)
Basophils Relative: 0 %
EOS PCT: 0 %
Eosinophils Absolute: 0 cells/uL — ABNORMAL LOW (ref 15–500)
HCT: 41.6 % (ref 35.0–45.0)
HEMOGLOBIN: 13.7 g/dL (ref 11.7–15.5)
LYMPHS ABS: 1720 {cells}/uL (ref 850–3900)
Lymphocytes Relative: 20 %
MCH: 28.8 pg (ref 27.0–33.0)
MCHC: 32.9 g/dL (ref 32.0–36.0)
MCV: 87.6 fL (ref 80.0–100.0)
MONOS PCT: 3 %
MPV: 9.6 fL (ref 7.5–12.5)
Monocytes Absolute: 258 cells/uL (ref 200–950)
NEUTROS PCT: 77 %
Neutro Abs: 6622 cells/uL (ref 1500–7800)
PLATELETS: 295 10*3/uL (ref 140–400)
RBC: 4.75 MIL/uL (ref 3.80–5.10)
RDW: 12.8 % (ref 11.0–15.0)
WBC: 8.6 10*3/uL (ref 3.8–10.8)

## 2016-09-02 LAB — ANTI-DNA ANTIBODY, DOUBLE-STRANDED: DS DNA AB: 1 [IU]/mL

## 2016-09-02 LAB — ROCKY MTN SPOTTED FVR ABS PNL(IGG+IGM)
RMSF IgG: NOT DETECTED
RMSF IgM: NOT DETECTED

## 2016-09-02 LAB — CYCLIC CITRUL PEPTIDE ANTIBODY, IGG

## 2016-09-02 LAB — SEDIMENTATION RATE: SED RATE: 1 mm/h (ref 0–20)

## 2016-09-02 LAB — C-REACTIVE PROTEIN: CRP: 3.8 mg/L (ref <8.0)

## 2016-09-02 LAB — C3 AND C4
C3 COMPLEMENT: 128 mg/dL (ref 90–180)
C4 COMPLEMENT: 17 mg/dL (ref 16–47)

## 2016-09-02 LAB — RHEUMATOID FACTOR: Rhuematoid fact SerPl-aCnc: <14 IU/mL (ref <14)

## 2016-09-02 LAB — CK: Total CK: 55 U/L (ref 7–177)

## 2016-09-03 LAB — LYME ABY, WSTRN BLT IGG & IGM W/BANDS
B BURGDORFERI IGG ABS (IB): NEGATIVE
B BURGDORFERI IGM ABS (IB): NEGATIVE
LYME DISEASE 23 KD IGG: NONREACTIVE
LYME DISEASE 23 KD IGM: NONREACTIVE
LYME DISEASE 39 KD IGG: NONREACTIVE
LYME DISEASE 41 KD IGG: NONREACTIVE
LYME DISEASE 45 KD IGG: NONREACTIVE
LYME DISEASE 66 KD IGG: NONREACTIVE
LYME DISEASE 93 KD IGG: NONREACTIVE
Lyme Disease 18 kD IgG: NONREACTIVE
Lyme Disease 28 kD IgG: NONREACTIVE
Lyme Disease 30 kD IgG: NONREACTIVE
Lyme Disease 39 kD IgM: NONREACTIVE
Lyme Disease 41 kD IgM: NONREACTIVE
Lyme Disease 58 kD IgG: NONREACTIVE

## 2016-09-04 LAB — COMPLEMENT, TOTAL: Compl, Total (CH50): 57 U/mL (ref 31–60)

## 2016-09-16 ENCOUNTER — Ambulatory Visit (INDEPENDENT_AMBULATORY_CARE_PROVIDER_SITE_OTHER): Payer: 59 | Admitting: Allergy and Immunology

## 2016-09-16 ENCOUNTER — Encounter: Payer: Self-pay | Admitting: Allergy and Immunology

## 2016-09-16 VITALS — BP 112/66 | HR 63 | Temp 98.0°F | Resp 16 | Ht 63.5 in | Wt 143.8 lb

## 2016-09-16 DIAGNOSIS — L309 Dermatitis, unspecified: Secondary | ICD-10-CM

## 2016-09-16 DIAGNOSIS — L500 Allergic urticaria: Secondary | ICD-10-CM | POA: Diagnosis not present

## 2016-09-16 DIAGNOSIS — J3089 Other allergic rhinitis: Secondary | ICD-10-CM

## 2016-09-16 DIAGNOSIS — K2900 Acute gastritis without bleeding: Secondary | ICD-10-CM | POA: Diagnosis not present

## 2016-09-16 HISTORY — DX: Dermatitis, unspecified: L30.9

## 2016-09-16 MED ORDER — LEVOCETIRIZINE DIHYDROCHLORIDE 5 MG PO TABS: 5.0000 mg | ORAL_TABLET | Freq: Every evening | ORAL | 5 refills | Status: DC

## 2016-09-16 MED ORDER — RANITIDINE HCL 150 MG PO TABS: 150.0000 mg | ORAL_TABLET | Freq: Two times a day (BID) | ORAL | 5 refills | Status: DC

## 2016-09-16 NOTE — Assessment & Plan Note (Addendum)
Unclear etiology. Skin tests to select food allergens were negative today. NSAIDs and emotional stress commonly exacerbate urticaria but are not the underlying etiology in this case. Physical urticarias are negative by history (i.e. pressure-induced, temperature, vibration, solar, etc.). History and lesions are not consistent with urticaria pigmentosa so I am not suspicious for mastocytosis. There are no concomitant symptoms concerning for anaphylaxis or constitutional symptoms worrisome for an underlying malignancy. We will rule out other potential etiologies with labs. For symptom relief, patient is to take oral antihistamines as directed.  The following labs have been ordered: FCeRI antibody, TSH, anti-thyroglobulin antibody, thyroid peroxidase antibody, tryptase, urea breath test, and galactose-alpha-1,3-galactose IgE level.  The patient will be called with further recommendations after lab results have returned.  Instructions have been discussed and provided for H1/H2 receptor blockade with titration to find lowest effective dose.  For now, continue montelukast 10 mg daily at bedtime.  A journal is to be kept recording any foods eaten, beverages consumed, medications taken within a 6 hour period prior to the onset of symptoms, as well as record activities being performed, and environmental conditions. For any symptoms concerning for anaphylaxis, 911 is to be called immediately.

## 2016-09-16 NOTE — Progress Notes (Signed)
New Patient Note  RE: Gabriela Ramirez MRN: [id number] DOB: [birth year] Date of Office Visit: 09/16/2016  Referring provider: Unk Pinto, MD Primary care provider: Alesia Richards, MD  Chief Complaint: Urticaria; Rash; and Allergic Rhinitis    History of present illness: Gabriela Ramirez is a 50 y.o. female presenting today for consultation of hives. Over the past 7 weeks, Gabriela Ramirez has experienced recurrent episodes of hives typically involving the scalp, neck, chest, back, arms and legs.   Initially, she had lesions which appear to be insect bites on her lower back and groin area.   Skin scrapings were negative for scabies.   Individual hives are described as erythematous, raised, and pruritic and resolve  within 24 hours without residual pigmentation or bruising. She denies concomitant angioedema, cardiopulmonary symptoms and GI symptoms. She has not experienced unexpected weight loss, recurrent fevers or drenching night sweats. No specific medication, food or environmental triggers have been identified. The symptoms do not seem to correlate with NSAIDs use or emotional stress. She did not have signs or symptoms of infection at the time of symptom onset. Gabriela Ramirez has tried to control symptoms with OTC antihistamines and prednisone which have offered moderate temporary relief of symptoms. She has not been evaluated and treated in the emergency department for these symptoms. Skin biopsy has not been performed.   Gabriela Ramirez experiences nasal congestion, rhinorrhea, sneezing, as well as occasional ocular pressure and mild sinus pressure.  No significant seasonal symptom variation has been noted nor have specific environmental triggers been identified.   Assessment and plan: Urticaria Unclear etiology. Skin tests to select food allergens were negative today. NSAIDs and emotional stress commonly exacerbate urticaria but are not the underlying etiology in this case. Physical urticarias are negative by  history (i.e. pressure-induced, temperature, vibration, solar, etc.). History and lesions are not consistent with urticaria pigmentosa so I am not suspicious for mastocytosis. There are no concomitant symptoms concerning for anaphylaxis or constitutional symptoms worrisome for an underlying malignancy. We will rule out other potential etiologies with labs. For symptom relief, patient is to take oral antihistamines as directed.  The following labs have been ordered: FCeRI antibody, TSH, anti-thyroglobulin antibody, thyroid peroxidase antibody, tryptase, urea breath test, and galactose-alpha-1,3-galactose IgE level.  The patient will be called with further recommendations after lab results have returned.  Instructions have been discussed and provided for H1/H2 receptor blockade with titration to find lowest effective dose.  For now, continue montelukast 10 mg daily at bedtime.  A journal is to be kept recording any foods eaten, beverages consumed, medications taken within a 6 hour period prior to the onset of symptoms, as well as record activities being performed, and environmental conditions. For any symptoms concerning for anaphylaxis, 911 is to be called immediately.  Dermatitis The patient's initial dermatitis did not share the same characteristics as the recurrent urticaria as discussed above. This did not sound to be urticaria, contact dermatitis, or atopic dermatitis. Food allergen skin tests were negative today despite a positive histamine control.  This problem is currently quiescent.  If symptoms recur, persist or progress, dermatology evaluation with biopsy of an active lesion is recommended.  Perennial allergic rhinitis  Aeroallergen avoidance measures have been discussed and provided in written form.  A prescription has been provided for levocetirizine (as above).  A prescription has been provided for fluticasone nasal spray, 2 sprays per nostril daily as needed. Proper nasal spray  technique has been discussed and demonstrated.  I have also recommended nasal saline  spray (i.e. Simply Saline) as needed prior to medicated nasal sprays.   Meds ordered this encounter  Medications  . levocetirizine (XYZAL) 5 MG tablet    Sig: Take 1 tablet (5 mg total) by mouth every evening.    Dispense:  30 tablet    Refill:  5  . ranitidine (ZANTAC) 150 MG tablet    Sig: Take 1 tablet (150 mg total) by mouth 2 (two) times daily.    Dispense:  60 tablet    Refill:  5    Diagnostics: Epicutaneous testing:  Negative despite a positive histamine control. Intradermal testing: Positive to molds. Food allergen skin testing:  Negative despite a positive histamine control.    Physical examination: Blood pressure 112/66, pulse 63, temperature 98 F (36.7 C), temperature source Oral, resp. rate 16, height 5' 3.5" (1.613 m), weight 143 lb 12.8 oz (65.2 kg), SpO2 97 %.  General: Alert, interactive, in no acute distress. HEENT: TMs pearly gray, turbinates moderately edematous without discharge, post-pharynx mildly erythematous. Neck: Supple without lymphadenopathy. Lungs: Clear to auscultation without wheezing, rhonchi or rales. CV: Normal S1, S2 without murmurs. Abdomen: Nondistended, nontender. Skin: Warm and dry, without lesions or rashes. Extremities:  No clubbing, cyanosis or edema. Neuro:   Grossly intact.  Review of systems:  Review of systems negative except as noted in HPI / PMHx or noted below: ROS Constitutional: Negative.  HENT: Negative.   Eyes: Negative.  Respiratory: Negative.   Cardiovascular: Negative.  Gastrointestinal: Negative.  Genitourinary: Negative.  Musculoskeletal: Negative.  Neurological: Negative.  Endo/Heme/Allergies: Negative.  Cutaneous: Negative.  Past medical history:  Past Medical History:  Diagnosis Date  . Anemia   . Anxiety   . Basal cell carcinoma    multiple  . Chest pain   . Dermatitis 09/16/2016  . Elevated cholesterol   .  Fibrocystic breast   . HPV (human papilloma virus) anogenital infection 2010  . Mixed hyperlipidemia 04/04/2014  . Sebaceous cyst    multiple  . Urticaria     Past surgical history:  Past Surgical History:  Procedure Laterality Date  . BASAL CELL CARCINOMA EXCISION     multiple  . DILITATION & CURRETTAGE/HYSTROSCOPY WITH VERSAPOINT RESECTION N/A 07/29/2013   Procedure: DILATATION & CURETTAGE/HYSTEROSCOPY WITH VERSAPOINT RESECTION;  Surgeon: Alwyn Pea, MD;  Location: [facility];  Service: Gynecology;  Laterality: N/A;  Hysteroscopy, Versapoint Myomectomy, D&C, Novasure    . NOVASURE ABLATION N/A 07/29/2013   Procedure: NOVASURE ABLATION;  Surgeon: Alwyn Pea, MD;  Location: [facility];  Service: Gynecology;  Laterality: N/A;    Family history: Family History  Problem Relation Age of Onset  . Adopted: Yes    Social history: Social History   Social History  . Marital status: Married    Spouse name: N/A  . Number of children: 2  . Years of education: N/A   Occupational History  . staffing firm manager    Social History Main Topics  . Smoking status: Never Smoker  . Smokeless tobacco: Never Used  . Alcohol use Yes     Comment: social  . Drug use: No  . Sexual activity: Yes    Partners: Male    Birth control/ protection: Pill   Other Topics Concern  . Not on file   Social History Narrative  . No narrative on file   Environmental History: The patient lives in a 50 year old house with hardwood floors throughout and central air/heat.  There is a dog in house which has access  to her bedroom.  She is a nonsmoker.    Medication List       Accurate as of 09/16/16  1:32 PM. Always use your most recent med list.          cetirizine 5 MG chewable tablet Commonly known as:  ZYRTEC Chew 5 mg by mouth daily.   clotrimazole-betamethasone cream Commonly known as:  LOTRISONE Apply to affected area 2 times daily   drospirenone-ethinyl estradiol 3-0.02 MG  tablet Commonly known as:  YAZ,GIANVI,LORYNA   levocetirizine 5 MG tablet Commonly known as:  XYZAL Take 1 tablet (5 mg total) by mouth every evening.   montelukast 10 MG tablet Commonly known as:  SINGULAIR Take 1 tablet (10 mg total) by mouth daily.   predniSONE 10 MG tablet Commonly known as:  DELTASONE   ranitidine 150 MG tablet Commonly known as:  ZANTAC Take 1 tablet (150 mg total) by mouth 2 (two) times daily.       Known medication allergies: No Known Allergies  I appreciate the opportunity to take part in [facility] care. Please do not hesitate to contact me with questions.  Sincerely,   R. Edgar Frisk, MD

## 2016-09-16 NOTE — Assessment & Plan Note (Addendum)
The patient's initial dermatitis did not share the same characteristics as the recurrent urticaria as discussed above. This did not sound to be urticaria, contact dermatitis, or atopic dermatitis. Food allergen skin tests were negative today despite a positive histamine control.  This problem is currently quiescent.  If symptoms recur, persist or progress, dermatology evaluation with biopsy of an active lesion is recommended.

## 2016-09-16 NOTE — Assessment & Plan Note (Signed)
   Aeroallergen avoidance measures have been discussed and provided in written form.  A prescription has been provided for levocetirizine (as above).  A prescription has been provided for fluticasone nasal spray, 2 sprays per nostril daily as needed. Proper nasal spray technique has been discussed and demonstrated.  I have also recommended nasal saline spray (i.e. Simply Saline) as needed prior to medicated nasal sprays.

## 2016-09-16 NOTE — Patient Instructions (Addendum)
Urticaria Unclear etiology. Skin tests to select food allergens were negative today. NSAIDs and emotional stress commonly exacerbate urticaria but are not the underlying etiology in this case. Physical urticarias are negative by history (i.e. pressure-induced, temperature, vibration, solar, etc.). History and lesions are not consistent with urticaria pigmentosa so I am not suspicious for mastocytosis. There are no concomitant symptoms concerning for anaphylaxis or constitutional symptoms worrisome for an underlying malignancy. We will rule out other potential etiologies with labs. For symptom relief, patient is to take oral antihistamines as directed.  The following labs have been ordered: FCeRI antibody, TSH, anti-thyroglobulin antibody, thyroid peroxidase antibody, tryptase, urea breath test, and galactose-alpha-1,3-galactose IgE level.  The patient will be called with further recommendations after lab results have returned.  Instructions have been discussed and provided for H1/H2 receptor blockade with titration to find lowest effective dose.  For now, continue montelukast 10 mg daily at bedtime.  A journal is to be kept recording any foods eaten, beverages consumed, medications taken within a 6 hour period prior to the onset of symptoms, as well as record activities being performed, and environmental conditions. For any symptoms concerning for anaphylaxis, 911 is to be called immediately.  Dermatitis The patient's initial dermatitis did not share the same characteristics as the recurrent urticaria as discussed above. This did not sound to be urticaria, contact dermatitis, or atopic dermatitis. Food allergen skin tests were negative today despite a positive histamine control.  This problem is currently quiescent.  If symptoms recur, persist or progress, dermatology evaluation with biopsy of an active lesion is recommended.  Perennial allergic rhinitis  Aeroallergen avoidance measures have been  discussed and provided in written form.  A prescription has been provided for levocetirizine (as above).  A prescription has been provided for fluticasone nasal spray, 2 sprays per nostril daily as needed. Proper nasal spray technique has been discussed and demonstrated.  I have also recommended nasal saline spray (i.e. Simply Saline) as needed prior to medicated nasal sprays.   When lab results have returned the patient will be called with further recommendations and follow up instructions.  Urticaria (Hives)  . Levocetirizine (Xyzal) 5 mg twice a day and ranitidine (Zantac) 150 mg twice a day. If no symptoms for 7-14 days then decrease to. . Levocetirizine (Xyzal) 5 mg twice a day and ranitidine (Zantac) 150 mg once a day.  If no symptoms for 7-14 days then decrease to. . Levocetirizine (Xyzal) 5 mg twice a day.  If no symptoms for 7-14 days then decrease to. . Levocetirizine (Xyzal) 5 mg once a day.  May use Benadryl (diphenhydramine) as needed for breakthrough symptoms       If symptoms return, then step up dosage  Control of Mold Allergen  Mold and fungi can grow on a variety of surfaces provided certain temperature and moisture conditions exist.  Outdoor molds grow on plants, decaying vegetation and soil.  The major outdoor mold, Alternaria and Cladosporium, are found in very high numbers during hot and dry conditions.  Generally, a late Summer - Fall peak is seen for common outdoor fungal spores.  Rain will temporarily lower outdoor mold spore count, but counts rise rapidly when the rainy period ends.  The most important indoor molds are Aspergillus and Penicillium.  Dark, humid and poorly ventilated basements are ideal sites for mold growth.  The next most common sites of mold growth are the bathroom and the kitchen.  Outdoor [employer] 1. Use air conditioning and keep windows closed  2. Avoid exposure to decaying vegetation. 3. Avoid leaf raking. 4. Avoid grain  handling. 5. Consider wearing a face mask if working in moldy areas.  Indoor Mold Control 1. Maintain humidity below 50%. 2. Clean washable surfaces with 5% bleach solution. 3. Remove sources e.g. Contaminated carpets.

## 2016-09-16 NOTE — Assessment & Plan Note (Deleted)
   Aeroallergen avoidance measures have been discussed and provided in written form.  A prescription has been provided for levocetirizine (as above).  A prescription has been provided for fluticasone nasal spray, 2 sprays per nostril daily as needed. Proper nasal spray technique has been discussed and demonstrated.  I have also recommended nasal saline spray (i.e. Simply Saline) as needed prior to medicated nasal sprays.

## 2017-04-30 ENCOUNTER — Encounter: Payer: Self-pay | Admitting: Internal Medicine

## 2017-05-07 ENCOUNTER — Encounter: Payer: Self-pay | Admitting: Internal Medicine

## 2017-05-26 DIAGNOSIS — Z85828 Personal history of other malignant neoplasm of skin: Secondary | ICD-10-CM | POA: Diagnosis not present

## 2017-05-26 DIAGNOSIS — Z86018 Personal history of other benign neoplasm: Secondary | ICD-10-CM | POA: Diagnosis not present

## 2017-05-26 DIAGNOSIS — L821 Other seborrheic keratosis: Secondary | ICD-10-CM | POA: Diagnosis not present

## 2017-08-11 NOTE — Progress Notes (Signed)
Complete Physical  Assessment and Plan:  Anemia, unspecified type - monitor, continue iron supp with Vitamin C and increase green leafy veggies -     Ferritin  Mixed hyperlipidemia -continue medications, check lipids, decrease fatty foods, increase activity.  -     CBC with Differential/Platelet -     BASIC METABOLIC PANEL WITH GFR -     Hepatic function panel -     Lipid panel  Perennial allergic rhinitis - Allegra OTC, increase H20, allergy hygiene explained.  Vitamin D deficiency -     VITAMIN D 25 Hydroxy (Vit-D Deficiency, Fractures)  Screening for thyroid disorder -     TSH  Screening for hematuria or proteinuria -     Urinalysis, Routine w reflex microscopic -     Microalbumin / creatinine urine ratio  Routine general medical examination at a health care facility  Medication management -     Magnesium  Screen for colon cancer -     Ambulatory referral to Gastroenterology  Discussed med's effects and SE's. Screening labs and tests as requested with regular follow-up as recommended. Over 40 minutes of exam, counseling, chart review, and complex, high level critical decision making was performed this visit.   HPI  51 y.o. female  presents for a complete physical and follow up for has Palpitations; Anemia; Mixed hyperlipidemia; Atheroma, skin; Urticaria; and Perennial allergic rhinitis on her problem list..  Her blood pressure has been controlled at home, today their BP is BP: 120/68 She does workout. She denies chest pain, shortness of breath, dizziness.   She is on cholesterol medication and denies myalgias. Her cholesterol is not at goal. The cholesterol last visit was:   Lab Results  Component Value Date   CHOL 222 (H) 05/07/2016   HDL 83 05/07/2016   LDLCALC 117 05/07/2016   TRIG 112 05/07/2016   CHOLHDL 2.7 05/07/2016    Last A1C in the office was:  Lab Results  Component Value Date   HGBA1C 5.3 05/07/2016   Patient is on Vitamin D supplement.    Lab Results  Component Value Date   VD25OH 35 05/07/2016     BMI is Body mass index is 24.64 kg/m., she is working on diet and exercise. Wt Readings from Last 3 Encounters:  08/12/17 145 lb 12.8 oz (66.1 kg)  09/16/16 143 lb 12.8 oz (65.2 kg)  09/01/16 145 lb 12.8 oz (66.1 kg)    Current Medications:  Current Outpatient Prescriptions on File Prior to Visit  Medication Sig Dispense Refill  . drospirenone-ethinyl estradiol (YAZ,GIANVI,LORYNA) 3-0.02 MG tablet      No current facility-administered medications on file prior to visit.    Allergies:  No Known Allergies Medical History:  She has Palpitations; Anemia; Mixed hyperlipidemia; Atheroma, skin; Urticaria; and Perennial allergic rhinitis on her problem list. Health Maintenance:   Immunization History  Administered Date(s) Administered  . DTaP 09/14/2009  . PPD Test 11/14/2013  . Pneumococcal-Unspecified 01/16/1998  . Td 02/06/1995   Tetanus: 2010 Pneumovax:1999 Prevnar 13:  Flu vaccine: Zostavax:  Pap: Dr. Melba Coon MGM: 09/2016, has OV in Oct DEXA: N/A Colonoscopy: DUE EGD: N/A  Patient Care Team: Unk Pinto, MD as PCP - General (Internal Medicine)  Surgical History:  She has a past surgical history that includes Excision basal cell carcinoma; Dilatation & currettage/hysteroscopy with versapoint resection (N/A, 07/29/2013); and Novasure ablation (N/A, 07/29/2013). Family History:  Herfamily history is not on file. She was adopted. Social History:  She reports that she has  never smoked. She has never used smokeless tobacco. She reports that she drinks alcohol. She reports that she does not use drugs.  Review of Systems: Review of Systems  Constitutional: Negative.   HENT: Negative.   Eyes: Negative.   Respiratory: Negative.   Cardiovascular: Negative.   Gastrointestinal: Negative.   Genitourinary: Negative.   Musculoskeletal: Negative.   Skin: Negative.   Neurological: Negative.    Endo/Heme/Allergies: Negative.   Psychiatric/Behavioral: Negative.     Physical Exam: Estimated body mass index is 24.64 kg/m as calculated from the following:   Height as of this encounter: 5' 4.5" (1.638 m).   Weight as of this encounter: 145 lb 12.8 oz (66.1 kg). BP 120/68   Pulse 64   Temp (!) 97.3 F (36.3 C)   Resp 16   Ht 5' 4.5" (1.638 m)   Wt 145 lb 12.8 oz (66.1 kg)   SpO2 99%   BMI 24.64 kg/m  General Appearance: Well nourished, in no apparent distress.  Eyes: PERRLA, EOMs, conjunctiva no swelling or erythema, normal fundi and vessels.  Sinuses: No Frontal/maxillary tenderness  ENT/Mouth: Ext aud canals clear, normal light reflex with TMs without erythema, bulging. Good dentition. No erythema, swelling, or exudate on post pharynx. Tonsils not swollen or erythematous. Hearing normal.  Neck: Supple, thyroid normal. No bruits  Respiratory: Respiratory effort normal, BS equal bilaterally without rales, rhonchi, wheezing or stridor.  Cardio: RRR without murmurs, rubs or gallops. Brisk peripheral pulses without edema.  Chest: symmetric, with normal excursions and percussion.  Breasts: Symmetric, without lumps, nipple discharge, retractions.  Abdomen: Soft, nontender, no guarding, rebound, hernias, masses, or organomegaly.  Lymphatics: Non tender without lymphadenopathy.  Genitourinary:  Musculoskeletal: Full ROM all peripheral extremities,5/5 strength, and normal gait.  Skin: Warm, dry without rashes, lesions, ecchymosis. Neuro: Cranial nerves intact, reflexes equal bilaterally. Normal muscle tone, no cerebellar symptoms. Sensation intact.  Psych: Awake and oriented X 3, normal affect, Insight and Judgment appropriate.   EKG: WNL no ST changes. AORTA SCAN: WNL   Vicie Mutters 9:24 AM [facility]

## 2017-08-12 ENCOUNTER — Ambulatory Visit (INDEPENDENT_AMBULATORY_CARE_PROVIDER_SITE_OTHER): Payer: 59 | Admitting: Physician Assistant

## 2017-08-12 ENCOUNTER — Encounter: Payer: Self-pay | Admitting: Gastroenterology

## 2017-08-12 ENCOUNTER — Encounter: Payer: Self-pay | Admitting: Physician Assistant

## 2017-08-12 VITALS — BP 120/68 | HR 64 | Temp 97.3°F | Resp 16 | Ht 64.5 in | Wt 145.8 lb

## 2017-08-12 DIAGNOSIS — Z0000 Encounter for general adult medical examination without abnormal findings: Secondary | ICD-10-CM

## 2017-08-12 DIAGNOSIS — E782 Mixed hyperlipidemia: Secondary | ICD-10-CM

## 2017-08-12 DIAGNOSIS — Z1211 Encounter for screening for malignant neoplasm of colon: Secondary | ICD-10-CM

## 2017-08-12 DIAGNOSIS — Z1389 Encounter for screening for other disorder: Secondary | ICD-10-CM

## 2017-08-12 DIAGNOSIS — J3089 Other allergic rhinitis: Secondary | ICD-10-CM

## 2017-08-12 DIAGNOSIS — Z1329 Encounter for screening for other suspected endocrine disorder: Secondary | ICD-10-CM

## 2017-08-12 DIAGNOSIS — D649 Anemia, unspecified: Secondary | ICD-10-CM

## 2017-08-12 DIAGNOSIS — E559 Vitamin D deficiency, unspecified: Secondary | ICD-10-CM

## 2017-08-12 DIAGNOSIS — Z79899 Other long term (current) drug therapy: Secondary | ICD-10-CM

## 2017-08-12 NOTE — Patient Instructions (Addendum)
Vionic  Add ENTERIC COATED low dose 81 mg Aspirin daily OR can do every other day if you have easy bruising to protect your heart and head. As well as to reduce risk of Colon Cancer by 20 %, Skin Cancer by 26 % , Melanoma by 46% and Pancreatic cancer by 60%    Plantar Fasciitis Plantar fasciitis is a painful foot condition that affects the heel. It occurs when the band of tissue that connects the toes to the heel bone (plantar fascia) becomes irritated. This can happen after exercising too much or doing other repetitive activities (overuse injury). The pain from plantar fasciitis can range from mild irritation to severe pain that makes it difficult for you to walk or move. The pain is usually worse in the morning or after you have been sitting or lying down for a while. What are the causes? This condition may be caused by:  Standing for long periods of time.  Wearing shoes that do not fit.  Doing high-impact activities, including running, aerobics, and ballet.  Being overweight.  Having an abnormal way of walking (gait).  Having tight calf muscles.  Having high arches in your feet.  Starting a new athletic activity.  What are the signs or symptoms? The main symptom of this condition is heel pain. Other symptoms include:  Pain that gets worse after activity or exercise.  Pain that is worse in the morning or after resting.  Pain that goes away after you walk for a few minutes.  How is this diagnosed? This condition may be diagnosed based on your signs and symptoms. Your health care provider will also do a physical exam to check for:  A tender area on the bottom of your foot.  A high arch in your foot.  Pain when you move your foot.  Difficulty moving your foot.  You may also need to have imaging studies to confirm the diagnosis. These can include:  X-rays.  Ultrasound.  MRI.  How is this treated? Treatment for plantar fasciitis depends on the severity of the  condition. Your treatment may include:  Rest, ice, and over-the-counter pain medicines to manage your pain.  Exercises to stretch your calves and your plantar fascia.  A splint that holds your foot in a stretched, upward position while you sleep (night splint).  Physical therapy to relieve symptoms and prevent problems in the future.  Cortisone injections to relieve severe pain.  Extracorporeal shock wave therapy (ESWT) to stimulate damaged plantar fascia with electrical impulses. It is often used as a last resort before surgery.  Surgery, if other treatments have not worked after 12 months.  Follow these instructions at home:  Take medicines only as directed by your health care provider.  Avoid activities that cause pain.  Roll the bottom of your foot over a bag of ice or a bottle of cold water. Do this for 20 minutes, 3-4 times a day.  Perform simple stretches as directed by your health care provider.  Try wearing athletic shoes with air-sole or gel-sole cushions or soft shoe inserts.  Wear a night splint while sleeping, if directed by your health care provider.  Keep all follow-up appointments with your health care provider. How is this prevented?  Do not perform exercises or activities that cause heel pain.  Consider finding low-impact activities if you continue to have problems.  Lose weight if you need to. The best way to prevent plantar fasciitis is to avoid the activities that aggravate your plantar  fascia. Contact a health care provider if:  Your symptoms do not go away after treatment with home care measures.  Your pain gets worse.  Your pain affects your ability to move or do your daily activities. This information is not intended to replace advice given to you by your health care provider. Make sure you discuss any questions you have with your health care provider. Document Released: 08/26/2001 Document Revised: 05/05/2016 Document Reviewed:  10/11/2014 Elsevier Interactive Patient Education  2018 [employer].  Achilles Tendinitis Achilles tendinitis is inflammation of the tough, cord-like band that attaches the lower leg muscles to the heel bone (Achilles tendon). This is usually caused by overusing the tendon and the ankle joint. Achilles tendinitis usually gets better over time with treatment and caring for yourself at home. It can take weeks or months to heal completely. What are the causes? This condition may be caused by:  A sudden increase in exercise or activity, such as running.  Doing the same exercises or activities (such as jumping) over and over.  Not warming up calf muscles before exercising.  Exercising in shoes that are worn out or not made for exercise.  Having arthritis or a bone growth (spur) on the back of the heel bone. This can rub against the tendon and hurt it.  Age-related wear and tear. Tendons become less flexible with age and more likely to be injured.  What are the signs or symptoms? Common symptoms of this condition include:  Pain in the Achilles tendon or in the back of the leg, just above the heel. The pain usually gets worse with exercise.  Stiffness or soreness in the back of the leg, especially in the morning.  Swelling of the skin over the Achilles tendon.  Thickening of the tendon.  Bone spurs at the bottom of the Achilles tendon, near the heel.  Trouble standing on tiptoe.  How is this diagnosed? This condition is diagnosed based on your symptoms and a physical exam. You may have tests, including:  X-rays.  MRI.  How is this treated? The goal of treatment is to relieve symptoms and help your injury heal. Treatment may include:  Decreasing or stopping activities that caused the tendinitis. This may mean switching to low-impact exercises like biking or swimming.  Icing the injured area.  Doing physical therapy, including strengthening and stretching  exercises.  NSAIDs to help relieve pain and swelling.  Using supportive shoes, wraps, heel lifts, or a walking boot (air cast).  Surgery. This may be done if your symptoms do not improve after 6 months.  Using high-energy shock wave impulses to stimulate the healing process (extracorporeal shock wave therapy). This is rare.  Injection of medicines to help relieve inflammation (corticosteroids). This is rare.  Follow these instructions at home: If you have an air cast:  Wear the cast as told by your health care provider. Remove it only as told by your health care provider.  Loosen the cast if your toes tingle, become numb, or turn cold and blue. Activity  Gradually return to your normal activities once your health care provider approves. Do not do activities that cause pain. ? Consider doing low-impact exercises, like cycling or swimming.  If you have an air cast, ask your health care provider when it is safe for you to drive.  If physical therapy was prescribed, do exercises as told by your health care provider or physical therapist. Managing pain, stiffness, and swelling  Raise (elevate) your foot above the  level of your heart while you are sitting or lying down.  Move your toes often to avoid stiffness and to lessen swelling.  If directed, put ice on the injured area: ? Put ice in a plastic bag. ? Place a towel between your skin and the bag. ? Leave the ice on for 20 minutes, 2-3 times a day General instructions  If directed, wrap your foot with an elastic bandage or other wrap. This can help keep your tendon from moving too much while it heals. Your health care provider will show you how to wrap your foot correctly.  Wear supportive shoes or heel lifts only as told by your health care provider.  Take over-the-counter and prescription medicines only as told by your health care provider.  Keep all follow-up visits as told by your health care provider. This is  important. Contact a health care provider if:  You have symptoms that gets worse.  You have pain that does not get better with medicine.  You develop new, unexplained symptoms.  You develop warmth and swelling in your foot.  You have a fever. Get help right away if:  You have a sudden popping sound or sensation in your Achilles tendon followed by severe pain.  You cannot move your toes or foot.  You cannot put any weight on your foot. Summary  Achilles tendinitis is inflammation of the tough, cord-like band that attaches the lower leg muscles to the heel bone (Achilles tendon).  This condition is usually caused by overusing the tendon and the ankle joint. It can also be caused by arthritis or normal aging.  The most common symptoms of this condition include pain, swelling, or stiffness in the Achilles tendon or in the back of the leg.  This condition is usually treated with rest, NSAIDs, and physical therapy. This information is not intended to replace advice given to you by your health care provider. Make sure you discuss any questions you have with your health care provider. Document Released: 09/10/2005 Document Revised: 10/20/2016 Document Reviewed: 10/20/2016 Elsevier Interactive Patient Education  2017 [employer].

## 2017-08-13 LAB — URINALYSIS, ROUTINE W REFLEX MICROSCOPIC
BILIRUBIN URINE: NEGATIVE
GLUCOSE, UA: NEGATIVE
Hgb urine dipstick: NEGATIVE
Ketones, ur: NEGATIVE
Leukocytes, UA: NEGATIVE
Nitrite: NEGATIVE
PH: 6 (ref 5.0–8.0)
Protein, ur: NEGATIVE
SPECIFIC GRAVITY, URINE: 1.022 (ref 1.001–1.03)

## 2017-08-13 LAB — CBC WITH DIFFERENTIAL/PLATELET
BASOS ABS: 49 {cells}/uL (ref 0–200)
Basophils Relative: 0.7 %
EOS ABS: 98 {cells}/uL (ref 15–500)
Eosinophils Relative: 1.4 %
HEMATOCRIT: 38.8 % (ref 35.0–45.0)
Hemoglobin: 12.8 g/dL (ref 11.7–15.5)
LYMPHS ABS: 2107 {cells}/uL (ref 850–3900)
MCH: 28.4 pg (ref 27.0–33.0)
MCHC: 33 g/dL (ref 32.0–36.0)
MCV: 86 fL (ref 80.0–100.0)
MPV: 10.6 fL (ref 7.5–12.5)
Monocytes Relative: 6.9 %
NEUTROS PCT: 60.9 %
Neutro Abs: 4263 cells/uL (ref 1500–7800)
Platelets: 244 10*3/uL (ref 140–400)
RBC: 4.51 10*6/uL (ref 3.80–5.10)
RDW: 11.8 % (ref 11.0–15.0)
Total Lymphocyte: 30.1 %
WBC: 7 10*3/uL (ref 3.8–10.8)
WBCMIX: 483 {cells}/uL (ref 200–950)

## 2017-08-13 LAB — HEPATIC FUNCTION PANEL
AG Ratio: 1.5 (calc) (ref 1.0–2.5)
ALBUMIN MSPROF: 4 g/dL (ref 3.6–5.1)
ALT: 10 U/L (ref 6–29)
AST: 13 U/L (ref 10–35)
Alkaline phosphatase (APISO): 45 U/L (ref 33–130)
BILIRUBIN TOTAL: 0.4 mg/dL (ref 0.2–1.2)
Bilirubin, Direct: 0.1 mg/dL (ref 0.0–0.2)
GLOBULIN: 2.6 g/dL (ref 1.9–3.7)
Indirect Bilirubin: 0.3 mg/dL (calc) (ref 0.2–1.2)
Total Protein: 6.6 g/dL (ref 6.1–8.1)

## 2017-08-13 LAB — LIPID PANEL
CHOL/HDL RATIO: 2.8 (calc) (ref <5.0)
CHOLESTEROL: 202 mg/dL — AB (ref <200)
HDL: 72 mg/dL (ref >50)
LDL CHOLESTEROL (CALC): 108 mg/dL — AB
Non-HDL Cholesterol (Calc): 130 mg/dL (calc) — ABNORMAL HIGH (ref <130)
TRIGLYCERIDES: 111 mg/dL (ref <150)

## 2017-08-13 LAB — BASIC METABOLIC PANEL WITH GFR
BUN: 17 mg/dL (ref 7–25)
CALCIUM: 8.7 mg/dL (ref 8.6–10.4)
CHLORIDE: 104 mmol/L (ref 98–110)
CO2: 24 mmol/L (ref 20–32)
Creat: 0.86 mg/dL (ref 0.50–1.05)
GFR, EST NON AFRICAN AMERICAN: 79 mL/min/{1.73_m2} (ref >=60)
GFR, Est African American: 91 mL/min/{1.73_m2} (ref >=60)
Glucose, Bld: 78 mg/dL (ref 65–99)
POTASSIUM: 3.8 mmol/L (ref 3.5–5.3)
Sodium: 136 mmol/L (ref 135–146)

## 2017-08-13 LAB — URINE CULTURE
MICRO NUMBER: 80946805
Result:: NO GROWTH
SPECIMEN QUALITY:: ADEQUATE

## 2017-08-13 LAB — VITAMIN D 25 HYDROXY (VIT D DEFICIENCY, FRACTURES): Vit D, 25-Hydroxy: 40 ng/mL (ref 30–100)

## 2017-08-13 LAB — MAGNESIUM: Magnesium: 2 mg/dL (ref 1.5–2.5)

## 2017-08-13 LAB — MICROALBUMIN / CREATININE URINE RATIO
Creatinine, Urine: 167 mg/dL (ref 20–320)
Microalb Creat Ratio: 4 mcg/mg creat (ref <30)
Microalb, Ur: 0.7 mg/dL

## 2017-08-13 LAB — FERRITIN: FERRITIN: 88 ng/mL (ref 10–232)

## 2017-08-13 LAB — TSH: TSH: 1.22 mIU/L

## 2017-09-03 ENCOUNTER — Ambulatory Visit (AMBULATORY_SURGERY_CENTER): Payer: Self-pay | Admitting: *Deleted

## 2017-09-03 VITALS — Ht 64.0 in | Wt 145.0 lb

## 2017-09-03 DIAGNOSIS — Z1211 Encounter for screening for malignant neoplasm of colon: Secondary | ICD-10-CM

## 2017-09-03 MED ORDER — NA SULFATE-K SULFATE-MG SULF 17.5-3.13-1.6 GM/177ML PO SOLN: 1.0000 | Freq: Once | ORAL | 0 refills | Status: AC

## 2017-09-03 NOTE — Progress Notes (Signed)
No egg or soy allergy known to patient  No issues with past sedation with any surgeries  or procedures, no past intubation  No diet pills per patient No home 02 use per patient  No blood thinners per patient  Pt denies issues with constipation  No A fib or A flutter  EMMI video sent to pt's e mail  $15 suprep coupon to pt today in [place]

## 2017-09-08 ENCOUNTER — Encounter: Payer: Self-pay | Admitting: Gastroenterology

## 2017-09-17 ENCOUNTER — Encounter: Payer: Self-pay | Admitting: Gastroenterology

## 2017-09-17 ENCOUNTER — Ambulatory Visit (AMBULATORY_SURGERY_CENTER): Payer: 59 | Admitting: Gastroenterology

## 2017-09-17 VITALS — BP 123/78 | HR 62 | Temp 97.8°F | Resp 10 | Ht 64.0 in | Wt 145.0 lb

## 2017-09-17 DIAGNOSIS — Z1211 Encounter for screening for malignant neoplasm of colon: Secondary | ICD-10-CM

## 2017-09-17 DIAGNOSIS — K633 Ulcer of intestine: Secondary | ICD-10-CM | POA: Diagnosis not present

## 2017-09-17 MED ORDER — SODIUM CHLORIDE 0.9 % IV SOLN: 500.0000 mL | INTRAVENOUS | Status: AC

## 2017-09-17 NOTE — Progress Notes (Signed)
Pt states no medical or surgical changes since previsit.

## 2017-09-17 NOTE — Patient Instructions (Signed)
Impression/Recommendations:  Resume previous diet. Continue present medications.  Repeat colonoscopy in 10 years for screening purposes.  YOU HAD AN ENDOSCOPIC PROCEDURE TODAY AT THE [facility]:   Refer to the procedure report that was given to you for any specific questions about what was found during the examination.  If the procedure report does not answer your questions, please call your gastroenterologist to clarify.  If you requested that your care partner not be given the details of your procedure findings, then the procedure report has been included in a sealed envelope for you to review at your convenience later.  YOU SHOULD EXPECT: Some feelings of bloating in the abdomen. Passage of more gas than usual.  Walking can help get rid of the air that was put into your GI tract during the procedure and reduce the bloating. If you had a lower endoscopy (such as a colonoscopy or flexible sigmoidoscopy) you may notice spotting of blood in your stool or on the toilet paper. If you underwent a bowel prep for your procedure, you may not have a normal bowel movement for a few days.  Please Note:  You might notice some irritation and congestion in your nose or some drainage.  This is from the oxygen used during your procedure.  There is no need for concern and it should clear up in a day or so.  SYMPTOMS TO REPORT IMMEDIATELY:   Following lower endoscopy (colonoscopy or flexible sigmoidoscopy):  Excessive amounts of blood in the stool  Significant tenderness or worsening of abdominal pains  Swelling of the abdomen that is new, acute  Fever of 100F or higher  For urgent or emergent issues, a gastroenterologist can be reached at any hour by calling [phone].   DIET:  We do recommend a small meal at first, but then you may proceed to your regular diet.  Drink plenty of fluids but you should avoid alcoholic beverages for 24 hours.  ACTIVITY:  You should plan to take it easy  for the rest of today and you should NOT DRIVE or use heavy machinery until tomorrow (because of the sedation medicines used during the test).    FOLLOW UP: Our staff will call the number listed on your records the next business day following your procedure to check on you and address any questions or concerns that you may have regarding the information given to you following your procedure. If we do not reach you, we will leave a message.  However, if you are feeling well and you are not experiencing any problems, there is no need to return our call.  We will assume that you have returned to your regular daily activities without incident.  If any biopsies were taken you will be contacted by phone or by letter within the next 1-3 weeks.  Please call us at [phone] if you have not heard about the biopsies in 3 weeks.    SIGNATURES/CONFIDENTIALITY: You and/or your care partner have signed paperwork which will be entered into your electronic medical record.  These signatures attest to the fact that that the information above on your After Visit Summary has been reviewed and is understood.  Full responsibility of the confidentiality of this discharge information lies with you and/or your care-partner. 

## 2017-09-17 NOTE — Progress Notes (Signed)
A and O x3. Report to RN. Tolerated MAC anesthesia well.

## 2017-09-17 NOTE — Op Note (Signed)
[place] Patient Name: Gabriela Ramirez Procedure Date: 09/17/2017 10:53 AM MRN: [id number] Endoscopist: Ettrick. Loletha Carrow , MD Age: 51 Referring MD:  Date of Birth: Sep 30, 1966 Gender: Female Account #: [id number] Procedure:                Colonoscopy Indications:              Screening for colorectal malignant neoplasm, This                            is the patient's first colonoscopy Medicines:                Monitored Anesthesia Care Procedure:                Pre-Anesthesia Assessment:                           - Prior to the procedure, a History and Physical                            was performed, and patient medications and                            allergies were reviewed. The patient's tolerance of                            previous anesthesia was also reviewed. The risks                            and benefits of the procedure and the sedation                            options and risks were discussed with the patient.                            All questions were answered, and informed consent                            was obtained. Prior Anticoagulants: The patient has                            taken no previous anticoagulant or antiplatelet                            agents. ASA Grade Assessment: II - A patient with                            mild systemic disease. After reviewing the risks                            and benefits, the patient was deemed in                            satisfactory condition to undergo the procedure.  After obtaining informed consent, the colonoscope                            was passed under direct vision. Throughout the                            procedure, the patient's blood pressure, pulse, and                            oxygen saturations were monitored continuously. The                            Model PCF-[id number]) scope was introduced                            through the anus and  advanced to the the terminal                            ileum. The colonoscopy was performed without                            difficulty. The patient tolerated the procedure                            well. The quality of the bowel preparation was                            excellent. The terminal ileum, ileocecal valve,                            appendiceal orifice, and rectum were photographed.                            The quality of the bowel preparation was evaluated                            using the BBPS [facility] Bowel Preparation Scale)                            with scores of: Right Colon = 3, Transverse Colon =                            3 and Left Colon = 3 (entire mucosa seen well with                            no residual staining, small fragments of stool or                            opaque liquid). The total BBPS score equals 9. The                            bowel preparation used was SUPREP. Scope In: 11:02:09 AM Scope Out: 11:21:08 AM Scope  Withdrawal Time: 0 hours 12 minutes 27 seconds  Total Procedure Duration: 0 hours 18 minutes 59 seconds  Findings:                 The perianal and digital rectal examinations were                            normal.                           A single, shallow, linear ulcer several centimeters                            in length with adjacent hyperemic and edematous                            mucosa was found in the distal descending/proximal                            sigmoid colon. No bleeding was present. No stigmata                            of recent bleeding were seen. Biopsies were taken                            from the ulcer edge with a cold forceps for                            histology.                           The terminal ileum appeared normal.                           The exam was otherwise without abnormality on                            direct and retroflexion views. Complications:            No  immediate complications. Estimated Blood Loss:     Estimated blood loss was minimal. Impression:               - A single ulcer in the proximal sigmoid colon.                            Biopsied.                           - The examined portion of the ileum was normal.                           - The examination was otherwise normal on direct                            and retroflexion views.  The patient reported an acute GI illness earlier                            this week. Colonoscopy finding may respresent                            residual effect from that process. Recommendation:           - Patient has a contact number available for                            emergencies. The signs and symptoms of potential                            delayed complications were discussed with the                            patient. Return to normal activities tomorrow.                            Written discharge instructions were provided to the                            patient.                           - Resume previous diet.                           - Continue present medications.                           - Await pathology results.                           - Repeat colonoscopy in 10 years for screening                            purposes. Henry L. Loletha Carrow, MD 09/17/2017 11:29:34 AM This report has been signed electronically.

## 2017-09-17 NOTE — Progress Notes (Signed)
Called to room to assist during endoscopic procedure.  Patient ID and intended procedure confirmed with present staff. Received instructions for my participation in the procedure from the performing physician.  

## 2017-09-18 ENCOUNTER — Telehealth: Payer: Self-pay

## 2017-09-18 NOTE — Telephone Encounter (Signed)
  Follow up Call-  Call back number 09/17/2017  Post procedure Call Back phone  # [phone]  Permission to leave phone message Yes  Some recent data might be hidden     Patient questions:  Do you have a fever, pain , or abdominal swelling? No. Pain Score  0 *  Have you tolerated food without any problems? Yes.    Have you been able to return to your normal activities? Yes.    Do you have any questions about your discharge instructions: Diet   No. Medications  No. Follow up visit  No.  Do you have questions or concerns about your Care? No.  Actions: * If pain score is 4 or above: No action needed, pain <4.

## 2017-09-22 DIAGNOSIS — Z6824 Body mass index (BMI) 24.0-24.9, adult: Secondary | ICD-10-CM | POA: Diagnosis not present

## 2017-09-22 DIAGNOSIS — Z1231 Encounter for screening mammogram for malignant neoplasm of breast: Secondary | ICD-10-CM | POA: Diagnosis not present

## 2017-09-22 DIAGNOSIS — Z01419 Encounter for gynecological examination (general) (routine) without abnormal findings: Secondary | ICD-10-CM | POA: Diagnosis not present

## 2018-06-04 DIAGNOSIS — D2230 Melanocytic nevi of unspecified part of face: Secondary | ICD-10-CM | POA: Diagnosis not present

## 2018-06-04 DIAGNOSIS — D225 Melanocytic nevi of trunk: Secondary | ICD-10-CM | POA: Diagnosis not present

## 2018-06-04 DIAGNOSIS — L821 Other seborrheic keratosis: Secondary | ICD-10-CM | POA: Diagnosis not present

## 2018-06-04 DIAGNOSIS — D485 Neoplasm of uncertain behavior of skin: Secondary | ICD-10-CM | POA: Diagnosis not present

## 2018-08-17 ENCOUNTER — Encounter: Payer: Self-pay | Admitting: Physician Assistant

## 2018-08-23 NOTE — Progress Notes (Signed)
Complete Physical  Assessment and Plan:  Anemia, unspecified type - monitor, continue iron supp with Vitamin C and increase green leafy veggies  Mixed hyperlipidemia -continue medications, check lipids, decrease fatty foods, increase activity.  -     CBC with Differential/Platelet -     BASIC METABOLIC PANEL WITH GFR -     Hepatic function panel -     Lipid panel  Perennial allergic rhinitis - Allegra OTC, increase H20, allergy hygiene explained.  Vitamin D deficiency -     VITAMIN D 25 Hydroxy (Vit-D Deficiency, Fractures)  Screening for thyroid disorder -     TSH  Screening for hematuria or proteinuria -     Urinalysis, Routine w reflex microscopic -     Microalbumin / creatinine urine ratio  Routine general medical examination at a health care facility  Medication management -     Magnesium  Neck pain -     Ambulatory referral to Physical Therapy   Discussed med's effects and SE's. Screening labs and tests as requested with regular follow-up as recommended. Over 40 minutes of exam, counseling, chart review, and complex, high level critical decision making was performed this visit.   HPI  52 y.o. DWF female  presents for a complete physical and follow up for has Palpitations; Anemia; Mixed hyperlipidemia; Atheroma, skin; Urticaria; and Perennial allergic rhinitis on their problem list..  Her blood pressure has been controlled at home, today their BP is BP: 118/62 She does workout. She denies chest pain, shortness of breath, dizziness.   She is working on coming off her BCP with her OB/GYN.   She is on cholesterol medication and denies myalgias. Her cholesterol is not at goal. The cholesterol last visit was:   Lab Results  Component Value Date   CHOL 202 (H) 08/12/2017   HDL 72 08/12/2017   LDLCALC 108 (H) 08/12/2017   TRIG 111 08/12/2017   CHOLHDL 2.8 08/12/2017    Last A1C in the office was:  Lab Results  Component Value Date   HGBA1C 5.3 05/07/2016    Patient is on Vitamin D supplement.   Lab Results  Component Value Date   VD25OH 40 08/12/2017     BMI is Body mass index is 25.2 kg/m., she is working on diet and exercise. Wt Readings from Last 3 Encounters:  08/25/18 146 lb 12.8 oz (66.6 kg)  09/17/17 145 lb (65.8 kg)  09/03/17 145 lb (65.8 kg)    Current Medications:  Current Outpatient Medications on File Prior to Visit  Medication Sig Dispense Refill  . drospirenone-ethinyl estradiol (YAZ,GIANVI,LORYNA) 3-0.02 MG tablet      Current Facility-Administered Medications on File Prior to Visit  Medication Dose Route Frequency Provider Last Rate Last Dose  . 0.9 %  sodium chloride infusion  500 mL Intravenous Continuous Danis, Estill Cotta III, MD       Allergies:  No Known Allergies   Medical History:  She has Palpitations; Anemia; Mixed hyperlipidemia; Atheroma, skin; Urticaria; and Perennial allergic rhinitis on their problem list.   Health Maintenance:   Immunization History  Administered Date(s) Administered  . DTaP 09/14/2009  . PPD Test 11/14/2013  . Pneumococcal-Unspecified 01/16/1998  . Td 02/06/1995   Tetanus: 2010 Pneumovax:1999 Prevnar 13:  Flu vaccine: Zostavax:  Pap: Dr. Melba Coon MGM: 09/2017 at GYN DEXA: N/A Colonoscopy: 2018 Dr. Loletha Carrow, 10 years.  EGD: N/A  Patient Care Team: Unk Pinto, MD as PCP - General (Internal Medicine)  Surgical History:  She has a  past surgical history that includes Excision basal cell carcinoma; Dilatation & currettage/hysteroscopy with versapoint resection (N/A, 07/29/2013); Novasure ablation (N/A, 07/29/2013); and Wisdom tooth extraction. Family History:  Herfamily history is not on file. She was adopted. Social History:  She reports that she has never smoked. She has never used smokeless tobacco. She reports that she drinks alcohol. She reports that she does not use drugs.  Review of Systems: Review of Systems  Constitutional: Negative.   HENT: Negative.    Eyes: Negative.   Respiratory: Negative.   Cardiovascular: Negative.   Gastrointestinal: Negative.   Genitourinary: Negative.   Musculoskeletal: Negative.   Skin: Negative.   Neurological: Negative.   Endo/Heme/Allergies: Negative.   Psychiatric/Behavioral: Negative.     Physical Exam: Estimated body mass index is 25.2 kg/m as calculated from the following:   Height as of this encounter: 5\' 4"  (1.626 m).   Weight as of this encounter: 146 lb 12.8 oz (66.6 kg). BP 118/62   Pulse (!) 55   Temp 99.1 F (37.3 C)   Resp 14   Ht 5\' 4"  (1.626 m)   Wt 146 lb 12.8 oz (66.6 kg)   SpO2 96%   BMI 25.20 kg/m  General Appearance: Well nourished, in no apparent distress.  Eyes: PERRLA, EOMs, conjunctiva no swelling or erythema, normal fundi and vessels.  Sinuses: No Frontal/maxillary tenderness  ENT/Mouth: Ext aud canals clear, normal light reflex with TMs without erythema, bulging. Good dentition. No erythema, swelling, or exudate on post pharynx. Tonsils not swollen or erythematous. Hearing normal.  Neck: Supple, thyroid normal. No bruits  Respiratory: Respiratory effort normal, BS equal bilaterally without rales, rhonchi, wheezing or stridor.  Cardio: RRR without murmurs, rubs or gallops. Brisk peripheral pulses without edema.  Chest: symmetric, with normal excursions and percussion.  Breasts: Symmetric, without lumps, nipple discharge, retractions.  Abdomen: Soft, nontender, no guarding, rebound, hernias, masses, or organomegaly.  Lymphatics: Non tender without lymphadenopathy.  Genitourinary:  Musculoskeletal: Full ROM all peripheral extremities,5/5 strength, and normal gait.  Skin: Warm, dry without rashes, lesions, ecchymosis. Neuro: Cranial nerves intact, reflexes equal bilaterally. Normal muscle tone, no cerebellar symptoms. Sensation intact.  Psych: Awake and oriented X 3, normal affect, Insight and Judgment appropriate.   EKG: WNL no ST changes. AORTA SCAN: WNL   Vicie Mutters 10:27 AM [facility]

## 2018-08-25 ENCOUNTER — Ambulatory Visit (INDEPENDENT_AMBULATORY_CARE_PROVIDER_SITE_OTHER): Payer: 59 | Admitting: Physician Assistant

## 2018-08-25 ENCOUNTER — Encounter: Payer: Self-pay | Admitting: Physician Assistant

## 2018-08-25 VITALS — BP 118/62 | HR 55 | Temp 99.1°F | Resp 14 | Ht 64.0 in | Wt 146.8 lb

## 2018-08-25 DIAGNOSIS — Z0000 Encounter for general adult medical examination without abnormal findings: Secondary | ICD-10-CM

## 2018-08-25 DIAGNOSIS — E559 Vitamin D deficiency, unspecified: Secondary | ICD-10-CM | POA: Diagnosis not present

## 2018-08-25 DIAGNOSIS — J3089 Other allergic rhinitis: Secondary | ICD-10-CM

## 2018-08-25 DIAGNOSIS — E782 Mixed hyperlipidemia: Secondary | ICD-10-CM | POA: Diagnosis not present

## 2018-08-25 DIAGNOSIS — Z79899 Other long term (current) drug therapy: Secondary | ICD-10-CM

## 2018-08-25 DIAGNOSIS — Z1329 Encounter for screening for other suspected endocrine disorder: Secondary | ICD-10-CM

## 2018-08-25 DIAGNOSIS — M542 Cervicalgia: Secondary | ICD-10-CM

## 2018-08-25 DIAGNOSIS — D649 Anemia, unspecified: Secondary | ICD-10-CM

## 2018-08-25 DIAGNOSIS — Z1389 Encounter for screening for other disorder: Secondary | ICD-10-CM

## 2018-08-26 LAB — COMPLETE METABOLIC PANEL WITH GFR
AG Ratio: 1.5 (calc) (ref 1.0–2.5)
ALKALINE PHOSPHATASE (APISO): 48 U/L (ref 33–130)
ALT: 10 U/L (ref 6–29)
AST: 14 U/L (ref 10–35)
Albumin: 4.4 g/dL (ref 3.6–5.1)
BILIRUBIN TOTAL: 0.4 mg/dL (ref 0.2–1.2)
BUN: 16 mg/dL (ref 7–25)
CHLORIDE: 102 mmol/L (ref 98–110)
CO2: 24 mmol/L (ref 20–32)
CREATININE: 0.86 mg/dL (ref 0.50–1.05)
Calcium: 9.3 mg/dL (ref 8.6–10.4)
GFR, Est African American: 91 mL/min/{1.73_m2} (ref >=60)
GFR, Est Non African American: 78 mL/min/{1.73_m2} (ref >=60)
GLOBULIN: 2.9 g/dL (ref 1.9–3.7)
Glucose, Bld: 85 mg/dL (ref 65–99)
Potassium: 4.2 mmol/L (ref 3.5–5.3)
SODIUM: 135 mmol/L (ref 135–146)
Total Protein: 7.3 g/dL (ref 6.1–8.1)

## 2018-08-26 LAB — CBC WITH DIFFERENTIAL/PLATELET
BASOS ABS: 62 {cells}/uL (ref 0–200)
Basophils Relative: 0.8 %
EOS ABS: 92 {cells}/uL (ref 15–500)
EOS PCT: 1.2 %
HCT: 39.2 % (ref 35.0–45.0)
Hemoglobin: 13 g/dL (ref 11.7–15.5)
Lymphs Abs: 2556 cells/uL (ref 850–3900)
MCH: 28.7 pg (ref 27.0–33.0)
MCHC: 33.2 g/dL (ref 32.0–36.0)
MCV: 86.5 fL (ref 80.0–100.0)
MONOS PCT: 6.5 %
MPV: 10.3 fL (ref 7.5–12.5)
NEUTROS PCT: 58.3 %
Neutro Abs: 4489 cells/uL (ref 1500–7800)
Platelets: 299 10*3/uL (ref 140–400)
RBC: 4.53 10*6/uL (ref 3.80–5.10)
RDW: 12.1 % (ref 11.0–15.0)
Total Lymphocyte: 33.2 %
WBC mixed population: 501 cells/uL (ref 200–950)
WBC: 7.7 10*3/uL (ref 3.8–10.8)

## 2018-08-26 LAB — URINALYSIS, ROUTINE W REFLEX MICROSCOPIC
Bilirubin Urine: NEGATIVE
Glucose, UA: NEGATIVE
HGB URINE DIPSTICK: NEGATIVE
KETONES UR: NEGATIVE
Leukocytes, UA: NEGATIVE
NITRITE: NEGATIVE
PH: 5.5 (ref 5.0–8.0)
Protein, ur: NEGATIVE
Specific Gravity, Urine: 1.014 (ref 1.001–1.03)

## 2018-08-26 LAB — MICROALBUMIN / CREATININE URINE RATIO
Creatinine, Urine: 86 mg/dL (ref 20–275)
MICROALB UR: 0.7 mg/dL
Microalb Creat Ratio: 8 mcg/mg creat (ref <30)

## 2018-08-26 LAB — LIPID PANEL
CHOLESTEROL: 231 mg/dL — AB (ref <200)
HDL: 77 mg/dL (ref >50)
LDL CHOLESTEROL (CALC): 132 mg/dL — AB
Non-HDL Cholesterol (Calc): 154 mg/dL (calc) — ABNORMAL HIGH (ref <130)
Total CHOL/HDL Ratio: 3 (calc) (ref <5.0)
Triglycerides: 113 mg/dL (ref <150)

## 2018-08-26 LAB — TSH: TSH: 2.03 m[IU]/L

## 2018-08-26 LAB — MAGNESIUM: MAGNESIUM: 2.1 mg/dL (ref 1.5–2.5)

## 2018-08-26 LAB — VITAMIN D 25 HYDROXY (VIT D DEFICIENCY, FRACTURES): Vit D, 25-Hydroxy: 32 ng/mL (ref 30–100)

## 2018-09-29 DIAGNOSIS — Z3040 Encounter for surveillance of contraceptives, unspecified: Secondary | ICD-10-CM | POA: Diagnosis not present

## 2018-09-29 DIAGNOSIS — Z6825 Body mass index (BMI) 25.0-25.9, adult: Secondary | ICD-10-CM | POA: Diagnosis not present

## 2018-09-29 DIAGNOSIS — Z01419 Encounter for gynecological examination (general) (routine) without abnormal findings: Secondary | ICD-10-CM | POA: Diagnosis not present

## 2018-09-29 DIAGNOSIS — Z1231 Encounter for screening mammogram for malignant neoplasm of breast: Secondary | ICD-10-CM | POA: Diagnosis not present

## 2019-02-17 DIAGNOSIS — B301 Conjunctivitis due to adenovirus: Secondary | ICD-10-CM | POA: Diagnosis not present

## 2019-08-31 NOTE — Progress Notes (Signed)
Complete Physical  Assessment and Plan: Encounter for general adult medical examination with abnormal findings 1 year  Anemia, unspecified type -     CBC with Differential/Platelet  Mixed hyperlipidemia -     Cardio IQ (R) Advanced Lipid Panel -   Works out, eats well, unknown family history will get advanced lipid testing  Atheroma, skin Monitor  Palpitations -     EKG 12-Lead  Perennial allergic rhinitis Get on allergy pill  Vitamin D deficiency -     VITAMIN D 25 Hydroxy (Vit-D Deficiency, Fractures)  Medication management -     CBC with Differential/Platelet -     COMPLETE METABOLIC PANEL WITH GFR -     Magnesium  Screening for thyroid disorder -     TSH  Screening for hematuria or proteinuria -     Urinalysis, Routine w reflex microscopic -     Microalbumin / creatinine urine ratio  Need for diphtheria-tetanus-pertussis (Tdap) vaccine -     Tdap vaccine greater than or equal to 7yo IM  TMJ (temporomandibular joint syndrome) information given to the patient, no gum/decrease hard foods, warm wet wash clothes, decrease stress,  can do massage, and exercise.  Very important to follow up with dentist  Discussed med's effects and SE's. Screening labs and tests as requested with regular follow-up as recommended. Over 40 minutes of exam, counseling, chart review, and complex, high level critical decision making was performed this visit.   HPI  53 y.o. DWF female  presents for a complete physical and follow up for has Palpitations; Anemia; Mixed hyperlipidemia; Atheroma, skin; Urticaria; and Perennial allergic rhinitis on their problem list..  Her blood pressure has been controlled at home, today their BP is BP: 120/82 She does workout. She denies chest pain, shortness of breath, dizziness.   She is still on her BCP, would like to come off eventually, following with Dr. Melba Coon, OB/GYN.   She will have right sided head pain, can last 1-2 days, worse with  rain/pressure changes. Some watery eyes, sinus symptoms with them. Some history of TMJ. Ibuprofen helps.  No changes in vision, no changes in speech.   She is on cholesterol medication and denies myalgias.  Her cholesterol is not at goal. Non smoker. Was adopted, unknown family history.  The cholesterol last visit was:   Lab Results  Component Value Date   CHOL 231 (H) 08/25/2018   HDL 77 08/25/2018   LDLCALC 132 (H) 08/25/2018   TRIG 113 08/25/2018   CHOLHDL 3.0 08/25/2018    Last A1C in the office was:  Lab Results  Component Value Date   HGBA1C 5.3 05/07/2016   Patient is on Vitamin D supplement.   Lab Results  Component Value Date   VD25OH 32 08/25/2018     BMI is Body mass index is 26.15 kg/m., she is working on diet and exercise. Wt Readings from Last 3 Encounters:  09/01/19 150 lb (68 kg)  08/25/18 146 lb 12.8 oz (66.6 kg)  09/17/17 145 lb (65.8 kg)    Current Medications:  Current Outpatient Medications on File Prior to Visit  Medication Sig Dispense Refill  . drospirenone-ethinyl estradiol (YAZ,GIANVI,LORYNA) 3-0.02 MG tablet      Current Facility-Administered Medications on File Prior to Visit  Medication Dose Route Frequency Provider Last Rate Last Dose  . 0.9 %  sodium chloride infusion  500 mL Intravenous Continuous Nelida Meuse III, MD       Allergies:  No Known Allergies  Medical History:  She has Palpitations; Anemia; Mixed hyperlipidemia; Atheroma, skin; Urticaria; and Perennial allergic rhinitis on their problem list.   Health Maintenance:   Immunization History  Administered Date(s) Administered  . DTaP 09/14/2009  . PPD Test 11/14/2013  . Pneumococcal-Unspecified 01/16/1998  . Td 02/06/1995   Tetanus: 2010 DUE Pneumovax:1999 Prevnar 13:  Flu vaccine: Zostavax:  Pap: Dr. Melba Coon MGM: 09/2018 at GYN DEXA: N/A Colonoscopy: 2018 Dr. Loletha Carrow, 10 years.  EGD: N/A  Patient Care Team: Unk Pinto, MD as PCP - General (Internal  Medicine)  Surgical History:  She has a past surgical history that includes Excision basal cell carcinoma; Dilatation & currettage/hysteroscopy with versapoint resection (N/A, 07/29/2013); Novasure ablation (N/A, 07/29/2013); and Wisdom tooth extraction. Family History:  Herfamily history is not on file. She was adopted. Social History:  She reports that she has never smoked. She has never used smokeless tobacco. She reports current alcohol use. She reports that she does not use drugs.  Review of Systems: Review of Systems  Constitutional: Negative.   HENT: Negative.   Eyes: Negative.   Respiratory: Negative.   Cardiovascular: Negative.   Gastrointestinal: Negative.   Genitourinary: Negative.   Musculoskeletal: Negative.   Skin: Negative.   Neurological: Negative.   Endo/Heme/Allergies: Negative.   Psychiatric/Behavioral: Negative.     Physical Exam: Estimated body mass index is 26.15 kg/m as calculated from the following:   Height as of this encounter: 5' 3.5" (1.613 m).   Weight as of this encounter: 150 lb (68 kg). BP 120/82   Pulse 60   Temp 97.6 F (36.4 C)   Ht 5' 3.5" (1.613 m)   Wt 150 lb (68 kg)   SpO2 99%   BMI 26.15 kg/m  General Appearance: Well nourished, in no apparent distress.  Eyes: PERRLA, EOMs, conjunctiva no swelling or erythema, normal fundi and vessels.  Sinuses: No Frontal/maxillary tenderness  ENT/Mouth: Ext aud canals clear, normal light reflex with TMs without erythema, bulging. Good dentition. No erythema, swelling, or exudate on post pharynx. Tonsils not swollen or erythematous. Hearing normal. + TMJ tenderness right side. No temporal tenderness.   Neck: Supple, thyroid normal. No bruits  Respiratory: Respiratory effort normal, BS equal bilaterally without rales, rhonchi, wheezing or stridor.  Cardio: RRR without murmurs, rubs or gallops. Brisk peripheral pulses without edema.  Chest: symmetric, with normal excursions and percussion.  Breasts:  Symmetric, without lumps, nipple discharge, retractions.  Abdomen: Soft, nontender, no guarding, rebound, hernias, masses, or organomegaly.  Lymphatics: Non tender without lymphadenopathy.  Genitourinary:  Musculoskeletal: Full ROM all peripheral extremities,5/5 strength, and normal gait.  Skin: Warm, dry without rashes, lesions, ecchymosis. Neuro: Cranial nerves intact, reflexes equal bilaterally. Normal muscle tone, no cerebellar symptoms. Sensation intact.  Psych: Awake and oriented X 3, normal affect, Insight and Judgment appropriate.   EKG: WNL, no ST changes AORTA SCAN: declines   Gabriela Ramirez 9:10 AM [facility]

## 2019-09-01 ENCOUNTER — Ambulatory Visit (INDEPENDENT_AMBULATORY_CARE_PROVIDER_SITE_OTHER): Payer: 59 | Admitting: Physician Assistant

## 2019-09-01 ENCOUNTER — Encounter: Payer: Self-pay | Admitting: Physician Assistant

## 2019-09-01 ENCOUNTER — Other Ambulatory Visit: Payer: Self-pay

## 2019-09-01 VITALS — BP 120/82 | HR 60 | Temp 97.6°F | Ht 63.5 in | Wt 150.0 lb

## 2019-09-01 DIAGNOSIS — E782 Mixed hyperlipidemia: Secondary | ICD-10-CM

## 2019-09-01 DIAGNOSIS — I10 Essential (primary) hypertension: Secondary | ICD-10-CM | POA: Diagnosis not present

## 2019-09-01 DIAGNOSIS — Z0000 Encounter for general adult medical examination without abnormal findings: Secondary | ICD-10-CM

## 2019-09-01 DIAGNOSIS — Z79899 Other long term (current) drug therapy: Secondary | ICD-10-CM

## 2019-09-01 DIAGNOSIS — Z136 Encounter for screening for cardiovascular disorders: Secondary | ICD-10-CM | POA: Diagnosis not present

## 2019-09-01 DIAGNOSIS — D649 Anemia, unspecified: Secondary | ICD-10-CM

## 2019-09-01 DIAGNOSIS — L720 Epidermal cyst: Secondary | ICD-10-CM

## 2019-09-01 DIAGNOSIS — R002 Palpitations: Secondary | ICD-10-CM

## 2019-09-01 DIAGNOSIS — Z0001 Encounter for general adult medical examination with abnormal findings: Secondary | ICD-10-CM

## 2019-09-01 DIAGNOSIS — J3089 Other allergic rhinitis: Secondary | ICD-10-CM

## 2019-09-01 DIAGNOSIS — Z23 Encounter for immunization: Secondary | ICD-10-CM

## 2019-09-01 DIAGNOSIS — Z1329 Encounter for screening for other suspected endocrine disorder: Secondary | ICD-10-CM

## 2019-09-01 DIAGNOSIS — Z1389 Encounter for screening for other disorder: Secondary | ICD-10-CM

## 2019-09-01 DIAGNOSIS — E559 Vitamin D deficiency, unspecified: Secondary | ICD-10-CM

## 2019-09-01 DIAGNOSIS — M26609 Unspecified temporomandibular joint disorder, unspecified side: Secondary | ICD-10-CM

## 2019-09-01 NOTE — Patient Instructions (Addendum)
Your LDL could improve, ideally we want it under a 100.  Your LDL is the bad cholesterol that can lead to heart attack and stroke. To lower your number you can decrease your fatty foods, red meat, cheese, milk and increase fiber like whole grains and veggies. You can also add a fiber supplement like Citracel or Benefiber, these do not cause gas and bloating and are safe to use. Especially if you have a strong family history of heart disease or stroke or you have evidence of plaque on any imaging like a chest xray, we may discuss at your next office visit putting you on a medication to get your number below 100.   Can call  Dr. Toy Cookey to get evaluated for dental appliance or TMJ information  # [phone]  Or can refer to integrative therapy for TMJ/neck pain  What is the TMJ? The temporomandibular (tem-PUH-ro-man-DIB-yoo-ler) joint, or the TMJ, connects the upper and lower jawbones. This joint allows the jaw to open wide and move back and forth when you chew, talk, or yawn.There are also several muscles that help this joint move. There can be muscle tightness and pain in the muscle that can cause several symptoms.  What causes TMJ pain? There are many causes of TMJ pain. Repeated chewing (for example, chewing gum) and clenching your teeth can cause pain in the joint. Some TMJ pain has no obvious cause. What can I do to ease the pain? There are many things you can do to help your pain get better. When you have pain:  Eat soft foods and stay away from chewy foods (for example, taffy) Try to use both sides of your mouth to chew Don't chew gum Massage Don't open your mouth wide (for example, during yawning or singing) Don't bite your cheeks or fingernails Lower your amount of stress and worry Applying a warm, damp washcloth to the joint may help. Over-the-counter pain medicines such as ibuprofen (one brand: Advil) or acetaminophen (one brand: Tylenol) might also help. Do not use these medicines  if you are allergic to them or if your doctor told you not to use them. How can I stop the pain from coming back? When your pain is better, you can do these exercises to make your muscles stronger and to keep the pain from coming back:  Resisted mouth opening: Place your thumb or two fingers under your chin and open your mouth slowly, pushing up lightly on your chin with your thumb. Hold for three to six seconds. Close your mouth slowly. Resisted mouth closing: Place your thumbs under your chin and your two index fingers on the ridge between your mouth and the bottom of your chin. Push down lightly on your chin as you close your mouth. Tongue up: Slowly open and close your mouth while keeping the tongue touching the roof of the mouth. Side-to-side jaw movement: Place an object about one fourth of an inch thick (for example, two tongue depressors) between your front teeth. Slowly move your jaw from side to side. Increase the thickness of the object as the exercise becomes easier Forward jaw movement: Place an object about one fourth of an inch thick between your front teeth and move the bottom jaw forward so that the bottom teeth are in front of the top teeth. Increase the thickness of the object as the exercise becomes easier. These exercises should not be painful. If it hurts to do these exercises, stop doing them and talk to your family doctor.

## 2019-09-02 LAB — URINALYSIS, ROUTINE W REFLEX MICROSCOPIC
Bilirubin Urine: NEGATIVE
Glucose, UA: NEGATIVE
Hgb urine dipstick: NEGATIVE
Ketones, ur: NEGATIVE
Leukocytes,Ua: NEGATIVE
Nitrite: NEGATIVE
Protein, ur: NEGATIVE
Specific Gravity, Urine: 1.035 (ref 1.001–1.03)
pH: 5.5 (ref 5.0–8.0)

## 2019-09-02 LAB — CBC WITH DIFFERENTIAL/PLATELET
Absolute Monocytes: 391 cells/uL (ref 200–950)
Basophils Absolute: 50 cells/uL (ref 0–200)
Basophils Relative: 0.8 %
Eosinophils Absolute: 107 cells/uL (ref 15–500)
Eosinophils Relative: 1.7 %
HCT: 38 % (ref 35.0–45.0)
Hemoglobin: 12.4 g/dL (ref 11.7–15.5)
Lymphs Abs: 2318 cells/uL (ref 850–3900)
MCH: 28.5 pg (ref 27.0–33.0)
MCHC: 32.6 g/dL (ref 32.0–36.0)
MCV: 87.4 fL (ref 80.0–100.0)
MPV: 10.3 fL (ref 7.5–12.5)
Monocytes Relative: 6.2 %
Neutro Abs: 3434 cells/uL (ref 1500–7800)
Neutrophils Relative %: 54.5 %
Platelets: 292 10*3/uL (ref 140–400)
RBC: 4.35 10*6/uL (ref 3.80–5.10)
RDW: 12.2 % (ref 11.0–15.0)
Total Lymphocyte: 36.8 %
WBC: 6.3 10*3/uL (ref 3.8–10.8)

## 2019-09-02 LAB — COMPLETE METABOLIC PANEL WITH GFR
AG Ratio: 1.6 (calc) (ref 1.0–2.5)
ALT: 10 U/L (ref 6–29)
AST: 13 U/L (ref 10–35)
Albumin: 4.1 g/dL (ref 3.6–5.1)
Alkaline phosphatase (APISO): 44 U/L (ref 37–153)
BUN: 21 mg/dL (ref 7–25)
CO2: 25 mmol/L (ref 20–32)
Calcium: 9.3 mg/dL (ref 8.6–10.4)
Chloride: 103 mmol/L (ref 98–110)
Creat: 0.83 mg/dL (ref 0.50–1.05)
GFR, Est African American: 94 mL/min/{1.73_m2} (ref >=60)
GFR, Est Non African American: 81 mL/min/{1.73_m2} (ref >=60)
Globulin: 2.6 g/dL (calc) (ref 1.9–3.7)
Glucose, Bld: 88 mg/dL (ref 65–99)
Potassium: 4.1 mmol/L (ref 3.5–5.3)
Sodium: 137 mmol/L (ref 135–146)
Total Bilirubin: 0.5 mg/dL (ref 0.2–1.2)
Total Protein: 6.7 g/dL (ref 6.1–8.1)

## 2019-09-02 LAB — MICROALBUMIN / CREATININE URINE RATIO
Creatinine, Urine: 252 mg/dL (ref 20–275)
Microalb Creat Ratio: 7 mcg/mg creat (ref <30)
Microalb, Ur: 1.7 mg/dL

## 2019-09-02 LAB — MAGNESIUM: Magnesium: 2 mg/dL (ref 1.5–2.5)

## 2019-09-02 LAB — VITAMIN D 25 HYDROXY (VIT D DEFICIENCY, FRACTURES): Vit D, 25-Hydroxy: 24 ng/mL — ABNORMAL LOW (ref 30–100)

## 2019-09-02 LAB — TSH: TSH: 1.69 mIU/L

## 2019-09-07 ENCOUNTER — Telehealth: Payer: Self-pay

## 2019-09-07 LAB — CARDIO IQ(R) ADVANCED LIPID PANEL
Apolipoprotein B: 113 mg/dL — ABNORMAL HIGH
Cholesterol: 226 mg/dL — ABNORMAL HIGH (ref <200)
HDL: 69 mg/dL (ref >=50)
LDL Cholesterol (Calc): 133 mg/dL (calc) — ABNORMAL HIGH (ref <100)
LDL Large: 8124 nmol/L (ref >6729)
LDL Medium: 329 nmol/L — ABNORMAL HIGH (ref <215)
LDL Particle Number: 1420 nmol/L — ABNORMAL HIGH (ref <1138)
LDL Peak Size: 222.3 Angstrom — ABNORMAL LOW (ref >222.9)
LDL Small: 218 nmol/L — ABNORMAL HIGH (ref <142)
Lipoprotein (a): 195 nmol/L — ABNORMAL HIGH (ref <75)
Non-HDL Cholesterol (Calc): 157 mg/dL (calc) — ABNORMAL HIGH (ref <130)
Total CHOL/HDL Ratio: 3.3 calc (ref <5.0)
Triglycerides: 129 mg/dL (ref <150)

## 2019-09-07 NOTE — Telephone Encounter (Signed)
Patient called with questions about her LDL numbers. I spoke with pt & conformed that she had MyChart & informed the patient that if she sent the provider a MyChart message the provider would response directly back to the patient.  Patient was informed that she could always call the office but that [place] would be a little bit quicker.  Patient states she will send you a message about her LDL's and she wasn't sure that she could use MyChart for sending questions but since talking with me she is more then happy to send you messages.

## 2019-11-01 LAB — HM MAMMOGRAPHY: HM Mammogram: NORMAL (ref 0–4)

## 2019-11-01 LAB — HM PAP SMEAR: HM Pap smear: NEGATIVE

## 2019-12-01 DIAGNOSIS — E559 Vitamin D deficiency, unspecified: Secondary | ICD-10-CM | POA: Insufficient documentation

## 2019-12-01 DIAGNOSIS — Z79899 Other long term (current) drug therapy: Secondary | ICD-10-CM | POA: Insufficient documentation

## 2019-12-01 NOTE — Progress Notes (Deleted)
FOLLOW UP  Assessment and Plan:   Hypertension -Continue medication, monitor blood pressure at home. Continue DASH diet.  Reminder to go to the ER if any CP, SOB, nausea, dizziness, severe HA, changes vision/speech, left arm numbness and tingling and jaw pain.  Cholesterol -Continue diet and exercise. Check cholesterol.   Abnormal glucose -Continue diet and exercise. Check A1C  Vitamin D Def - check level and continue medications.   Continue diet and meds as discussed. Further disposition pending results of labs. Over 30 minutes of exam, counseling, chart review, and critical decision making was performed  Future Appointments  Date Time Provider [place]  12/05/2019  9:30 AM Vicie Mutters, PA-C [facility] None  03/07/2020  9:30 AM Vicie Mutters, PA-C [facility] None  08/31/2020  9:30 AM Vicie Mutters, PA-C [facility] None     HPI 53 y.o. female  presents for 3 month follow up on hypertension, cholesterol, prediabetes, and vitamin D deficiency.   Her blood pressure {HAS HAS NOT:18834} been controlled at home, today their BP is    BMI is There is no height or weight on file to calculate BMI., she is working on diet and exercise. Wt Readings from Last 3 Encounters:  09/01/19 150 lb (68 kg)  08/25/18 146 lb 12.8 oz (66.6 kg)  09/17/17 145 lb (65.8 kg)     She {DOES_DOES NF:[id number] workout. She denies chest pain, shortness of breath, dizziness.   She  {ACTION; IS/IS [id number]  on cholesterol medication and denies myalgias. Her cholesterol {ACTION; IS/IS NOT:21021397} at goal. The cholesterol last visit was:   Lab Results  Component Value Date   CHOL 226 (H) 09/01/2019   HDL 69 09/01/2019   LDLCALC 133 (H) 09/01/2019   TRIG 129 09/01/2019   CHOLHDL 3.3 09/01/2019     She {Has/has not:18111} been working on diet and exercise for prediabetes, and denies {Symptoms; diabetes w/o none:19199}. Last A1C in the office was:  Lab Results  Component Value Date   HGBA1C 5.3 05/07/2016    Patient {ACTION; IS/IS [id number] on Vitamin D supplement.   Lab Results  Component Value Date   VD25OH 24 (L) 09/01/2019       Current Medications:   Current Outpatient Medications (Endocrine & Metabolic):  .  drospirenone-ethinyl estradiol (YAZ,GIANVI,LORYNA) 3-0.02 MG tablet,             Current Facility-Administered Medications (Other):  .  0.9 %  sodium chloride infusion  Medical History:  Past Medical History:  Diagnosis Date  . Allergy    mild  . Anemia   . Anxiety   . 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 Basal cell carcinoma    multiple  . Chest pain   . Dermatitis 09/16/2016  . Elevated cholesterol   . Fibrocystic breast   . HPV (human papilloma virus) anogenital infection 2010  . Mixed hyperlipidemia 04/04/2014  . Sebaceous cyst    multiple  . Urticaria    Allergies: No Known Allergies   Review of Systems:  ROS  Family history- Review and unchanged Social history- Review and unchanged Physical Exam: There were no vitals taken for this visit. Wt Readings from Last 3 Encounters:  09/01/19 150 lb (68 kg)  08/25/18 146 lb 12.8 oz (66.6 kg)  09/17/17 145 lb (65.8 kg)   General Appearance: Well nourished, in no apparent distress. Eyes: PERRLA, EOMs, conjunctiva no swelling or erythema Sinuses: No Frontal/maxillary tenderness ENT/Mouth: Ext aud canals clear, TMs without erythema, bulging. No erythema,  swelling, or exudate on post pharynx.  Tonsils not swollen or erythematous. Hearing normal.  Neck: Supple, thyroid normal.  Respiratory: Respiratory effort normal, BS equal bilaterally without rales, rhonchi, wheezing or stridor.  Cardio: RRR with no MRGs. Brisk peripheral pulses without edema.  Abdomen: Soft, + BS,  Non tender, no guarding, rebound, hernias, masses. Lymphatics: Non tender without lymphadenopathy.  Musculoskeletal: Full ROM, 5/5 strength, {PSY - GAIT AND STATION:22860} gait Skin: Warm, dry without  rashes, lesions, ecchymosis.  Neuro: Cranial nerves intact. Normal muscle tone, no cerebellar symptoms. Psych: Awake and oriented X 3, normal affect, Insight and Judgment appropriate.    Vicie Mutters, PA-C 10:12 AM [facility]

## 2019-12-02 NOTE — Progress Notes (Signed)
FOLLOW UP  Assessment and Plan:   Cholesterol Currently above goal; has been working on lifestyle per her strong preference to avoid adding a medication  Lifestyle reviewed; continue citrucel, red yeast rice supplement suggested LDL goal <100  Continue low cholesterol diet and exercise.  Check lipid panel.   BMI 26 Continue to recommend diet heavy in fruits and veggies and low in animal meats, cheeses, and dairy products, appropriate calorie intake Discuss exercise recommendations routinely Continue to monitor weight at each visit  Vitamin D Def Below goal at last visit; newly taking 4000-5000 IU daily  continue supplementation to maintain goal of 60-100 Check Vit D level  Continue diet and meds as discussed. Further disposition pending results of labs. Discussed med's effects and SE's.   Over 15 minutes of exam, counseling, chart review, and critical decision making was performed.   Future Appointments  Date Time Provider [place]  03/07/2020  9:30 AM Vicie Mutters, PA-C [facility] None  08/31/2020  9:30 AM Vicie Mutters, PA-C [facility] None    ----------------------------------------------------------------------------------------------------------------------  HPI BP 124/80   Pulse 61   Temp 97.7 F (36.5 C)   Ht 5' 3.5" (1.613 m)   Wt 149 lb 9.6 oz (67.9 kg)   SpO2 99%   BMI 26.08 kg/m   53 y.o. female  presents for 3 month follow up on cholesterol, weight and vitamin D deficiency.   BMI is Body mass index is 26.08 kg/m., she has been working on diet and exercise. 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  Wt Readings from Last 3 Encounters:  12/05/19 149 lb 9.6 oz (67.9 kg)  09/01/19 150 lb (68 kg)  08/25/18 146 lb 12.8 oz (66.6 kg)    She is not on cholesterol medication She underwent cardio IQ panel last visit which showed LDL particle number very high at 1420 Apolipoprotein B is 113 Adopted; family history unknown She was  recommended initiation of cholesterol medication but requested trial of further lifestyle modification, has been increasing fiber, added citrucel, reducing cheese, doing more fish, "shaklee" cholesterol reduction product,  The cholesterol last visit was:   Lab Results  Component Value Date   CHOL 226 (H) 09/01/2019   HDL 69 09/01/2019   LDLCALC 133 (H) 09/01/2019   TRIG 129 09/01/2019   CHOLHDL 3.3 09/01/2019    Patient has recent history of vitamin D deficiency and is on supplement, now taking 4000 Lab Results  Component Value Date   VD25OH 24 (L) 09/01/2019       Current Medications:  Current Outpatient Medications on File Prior to Visit  Medication Sig  . drospirenone-ethinyl estradiol (YAZ,GIANVI,LORYNA) 3-0.02 MG tablet    Current Facility-Administered Medications on File Prior to Visit  Medication  . 0.9 %  sodium chloride infusion     Allergies: No Known Allergies   Medical History:  Past Medical History:  Diagnosis Date  . Allergy    mild  . Anemia   . Anxiety   . 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 Basal cell carcinoma    multiple  . Chest pain   . Dermatitis 09/16/2016  . Elevated cholesterol   . Fibrocystic breast   . HPV (human papilloma virus) anogenital infection 2010  . Mixed hyperlipidemia 04/04/2014  . Sebaceous cyst    multiple  . Urticaria    Family history- Reviewed and unchanged Social history- Reviewed and unchanged   Review of Systems:  Review of Systems  Constitutional: Negative for malaise/fatigue and weight loss.  HENT: Negative for hearing loss and tinnitus.   Eyes: Negative for blurred vision and double vision.  Respiratory: Negative for cough, shortness of breath and wheezing.   Cardiovascular: Negative for chest pain, palpitations, orthopnea, claudication and leg swelling.  Gastrointestinal: Negative for abdominal pain, blood in stool, constipation, diarrhea, heartburn, melena, nausea and vomiting.  Genitourinary:  Negative.   Musculoskeletal: Negative for joint pain and myalgias.  Skin: Negative for rash.  Neurological: Negative for dizziness, tingling, sensory change, weakness and headaches.  Endo/Heme/Allergies: Negative for polydipsia.  Psychiatric/Behavioral: Negative.   All other systems reviewed and are negative.     Physical Exam: BP 124/80   Pulse 61   Temp 97.7 F (36.5 C)   Ht 5' 3.5" (1.613 m)   Wt 149 lb 9.6 oz (67.9 kg)   SpO2 99%   BMI 26.08 kg/m  Wt Readings from Last 3 Encounters:  12/05/19 149 lb 9.6 oz (67.9 kg)  09/01/19 150 lb (68 kg)  08/25/18 146 lb 12.8 oz (66.6 kg)   General Appearance: Well nourished, in no apparent distress. Eyes: PERRLA, conjunctiva no swelling or erythema ENT/Mouth: mask in place; oral exam deferred. Hearing normal.  Neck: Supple Respiratory: Respiratory effort normal, BS equal bilaterally without rales, rhonchi, wheezing or stridor.  Cardio: RRR with no MRGs. Brisk peripheral pulses without edema.  Abdomen: Soft, + BS.  Non tender. Lymphatics: Non tender without lymphadenopathy.  Musculoskeletal: No obvious deformity, Normal gait Skin: Warm, dry without rashes, lesions, ecchymosis.  Neuro: Normal muscle tone Psych: Awake and oriented X 3, normal affect, Insight and Judgment appropriate.    Izora Ribas, NP 9:41 AM [facility]

## 2019-12-05 ENCOUNTER — Encounter: Payer: Self-pay | Admitting: Adult Health

## 2019-12-05 ENCOUNTER — Ambulatory Visit (INDEPENDENT_AMBULATORY_CARE_PROVIDER_SITE_OTHER): Payer: 59 | Admitting: Adult Health

## 2019-12-05 ENCOUNTER — Other Ambulatory Visit: Payer: Self-pay

## 2019-12-05 ENCOUNTER — Ambulatory Visit: Payer: 59 | Admitting: Physician Assistant

## 2019-12-05 VITALS — BP 124/80 | HR 61 | Temp 97.7°F | Ht 63.5 in | Wt 149.6 lb

## 2019-12-05 DIAGNOSIS — E782 Mixed hyperlipidemia: Secondary | ICD-10-CM | POA: Diagnosis not present

## 2019-12-05 DIAGNOSIS — E559 Vitamin D deficiency, unspecified: Secondary | ICD-10-CM | POA: Diagnosis not present

## 2019-12-05 DIAGNOSIS — Z79899 Other long term (current) drug therapy: Secondary | ICD-10-CM

## 2019-12-05 DIAGNOSIS — Z6826 Body mass index (BMI) 26.0-26.9, adult: Secondary | ICD-10-CM

## 2019-12-05 NOTE — Patient Instructions (Addendum)
Consider adding red yeast rice supplement (natural herb that statin type cholesterol medications were developed from)  Read labels for saturated and trans fats -   These are actually more strongly associated with increased cholesterol than cholesterol content in foods  Try to use olive oil or avocado oil  Avoid processed foods which tend to have high saturated/trans fats content     Preventing High Cholesterol Cholesterol is a white, waxy substance similar to fat that the human body needs to help build cells. The liver makes all the cholesterol that a person's body needs. Having high cholesterol (hypercholesterolemia) increases a person's risk for heart disease and stroke. Extra (excess) cholesterol comes from the food the person eats. High cholesterol can often be prevented with diet and lifestyle changes. If you already have high cholesterol, you can control it with diet and lifestyle changes and with medicine. How can high cholesterol affect me? If you have high cholesterol, deposits (plaques) may build up on the walls of your arteries. The arteries are the blood vessels that carry blood away from your heart. Plaques make the arteries narrower and stiffer. This can limit or block blood flow and cause blood clots to form. Blood clots:  Are tiny balls of cells that form in your blood.  Can move to the heart or brain, causing a heart attack or stroke. Plaques in arteries greatly increase your risk for heart attack and stroke.Making diet and lifestyle changes can reduce your risk for these conditions that may threaten your life. What can increase my risk? This condition is more likely to develop in people who:  Eat foods that are high in saturated fat or cholesterol. Saturated fat is mostly found in: ? Foods that contain animal fat, such as red meat and some dairy products. ? Certain fatty foods made from plants, such as tropical oils.  Are overweight.  Are not getting enough  exercise.  Have a family history of high cholesterol. What actions can I take to prevent this? Nutrition   Eat less saturated fat.  Avoid trans fats (partially hydrogenated oils). These are often found in margarine and in some baked goods, fried foods, and snacks bought in packages.  Avoid precooked or cured meat, such as sausages or meat loaves.  Avoid foods and drinks that have added sugars.  Eat more fruits, vegetables, and whole grains.  Choose healthy sources of protein, such as fish, poultry, lean cuts of red meat, beans, peas, lentils, and nuts.  Choose healthy sources of fat, such as: ? Nuts. ? Vegetable oils, especially olive oil. ? Fish that have healthy fats (omega-3 fatty acids), such as mackerel or salmon. The items listed above may not be a complete list of recommended foods and beverages. Contact a dietitian for more information. Lifestyle  Lose weight if you are overweight. Losing 5-10 lb (2.3-4.5 kg) can help prevent or control high cholesterol. It can also lower your risk for diabetes and high blood pressure. Ask your health care provider to help you with a diet and exercise plan to lose weight safely.  Do not use any products that contain nicotine or tobacco, such as cigarettes, e-cigarettes, and chewing tobacco. If you need help quitting, ask your health care provider.  Limit your alcohol intake. ? Do not drink alcohol if:  Your health care provider tells you not to drink.  You are pregnant, may be pregnant, or are planning to become pregnant. ? If you drink alcohol:  Limit how much you use to:  0-1 drink a day for women.  0-2 drinks a day for men.  Be aware of how much alcohol is in your drink. In the [place] one drink equals one 12 oz bottle of beer (355 mL), one 5 oz glass of wine (148 mL), or one 1 oz glass of hard liquor (44 mL). Activity   Get enough exercise. Each week, do at least 150 minutes of exercise that takes a medium level of effort  (moderate-intensity exercise). ? This is exercise that:  Makes your heart beat faster and makes you breathe harder than usual.  Allows you to still be able to talk. ? You could exercise in short sessions several times a day or longer sessions a few times a week. For example, on 5 days each week, you could walk fast or ride your bike 3 times a day for 10 minutes each time.  Do exercises as told by your health care provider. Medicines  In addition to diet and lifestyle changes, your health care provider may recommend medicines to help lower cholesterol. This may be a medicine to lower the amount of cholesterol your liver makes. You may need medicine if: ? Diet and lifestyle changes do not lower your cholesterol enough. ? You have high cholesterol and other risk factors for heart disease or stroke.  Take over-the-counter and prescription medicines only as told by your health care provider. General information  Manage your risk factors for high cholesterol. Talk with your health care provider about all your risk factors and how to lower your risk.  Manage other conditions that you have, such as diabetes or high blood pressure (hypertension).  Have blood tests to check your cholesterol levels at regular points in time as told by your health care provider.  Keep all follow-up visits as told by your health care provider. This is important. Where to find more information  American Heart Association: www.heart.org  National Heart, Lung, and Blood Institute: https://wilson-eaton.com/ Summary  High cholesterol increases your risk for heart disease and stroke. By keeping your cholesterol level low, you can reduce your risk for these conditions.  High cholesterol can often be prevented with diet and lifestyle changes.  Work with your health care provider to manage your risk factors, and have your blood tested regularly. This information is not intended to replace advice given to you by your health  care provider. Make sure you discuss any questions you have with your health care provider. Document Released: 12/16/2015 Document Revised: 03/25/2019 Document Reviewed: 08/09/2016 Elsevier Patient Education  2020 [employer].

## 2019-12-06 LAB — COMPLETE METABOLIC PANEL WITH GFR
AG Ratio: 1.5 (calc) (ref 1.0–2.5)
ALT: 11 U/L (ref 6–29)
AST: 12 U/L (ref 10–35)
Albumin: 4 g/dL (ref 3.6–5.1)
Alkaline phosphatase (APISO): 47 U/L (ref 37–153)
BUN: 20 mg/dL (ref 7–25)
CO2: 26 mmol/L (ref 20–32)
Calcium: 9.1 mg/dL (ref 8.6–10.4)
Chloride: 105 mmol/L (ref 98–110)
Creat: 0.84 mg/dL (ref 0.50–1.05)
GFR, Est African American: 92 mL/min/{1.73_m2} (ref >=60)
GFR, Est Non African American: 79 mL/min/{1.73_m2} (ref >=60)
Globulin: 2.7 g/dL (calc) (ref 1.9–3.7)
Glucose, Bld: 92 mg/dL (ref 65–99)
Potassium: 4.3 mmol/L (ref 3.5–5.3)
Sodium: 138 mmol/L (ref 135–146)
Total Bilirubin: 0.5 mg/dL (ref 0.2–1.2)
Total Protein: 6.7 g/dL (ref 6.1–8.1)

## 2019-12-06 LAB — LIPID PANEL
Cholesterol: 224 mg/dL — ABNORMAL HIGH (ref <200)
HDL: 62 mg/dL (ref >=50)
LDL Cholesterol (Calc): 137 mg/dL (calc) — ABNORMAL HIGH
Non-HDL Cholesterol (Calc): 162 mg/dL (calc) — ABNORMAL HIGH (ref <130)
Total CHOL/HDL Ratio: 3.6 (calc) (ref <5.0)
Triglycerides: 127 mg/dL (ref <150)

## 2019-12-06 LAB — VITAMIN D 25 HYDROXY (VIT D DEFICIENCY, FRACTURES): Vit D, 25-Hydroxy: 38 ng/mL (ref 30–100)

## 2020-01-02 NOTE — Progress Notes (Signed)
  Assessment and Plan:  Would like antibody testing- too early, only 3 weeks, return in 2 weeks to assure more accurate test.  We discussed the cons of the test at this time, false positives, false negatives We discussed that the antibody result will not change management, there is no known immunity incurred by a positive result, she will still be encouraged to hand wash, wear a mask and socially distance.    LDL/elevated lipoprotein A with unknown family history Suggest statin, will think about it  Future Appointments  Date Time Provider [place]  03/07/2020  9:30 AM Vicie Mutters, PA-C [facility] None  08/31/2020  9:30 AM Vicie Mutters, PA-C [facility] None     HPI 54 y.o.female presents for possible COVID antibody testing.   Her daughter works in [place] in [employer], mining. States she had exposure Christmas and again after Christmas, had negative rapid antigen. Also had daughter that tested positive for COVID, she had been having some intermittent    Patient Active Problem List   Diagnosis Date Noted  . Medication management 12/01/2019  . Vitamin D deficiency 12/01/2019  . Urticaria 09/16/2016  . Perennial allergic rhinitis 09/16/2016  . Atheroma, skin 08/21/2015  . Mixed hyperlipidemia 04/04/2014  . Palpitations 11/30/2012     Current Outpatient Medications (Endocrine & Metabolic):  .  drospirenone-ethinyl estradiol (YAZ,GIANVI,LORYNA) 3-0.02 MG tablet,            Current Outpatient Medications (Other):  Marland Kitchen  CHOLECALCIFEROL PO, Take 4,000 Units by mouth daily.  Current Facility-Administered Medications (Other):  .  0.9 %  sodium chloride infusion  No Known Allergies  ROS: all negative except above.   Physical Exam: There were no vitals filed for this visit. There were no vitals taken for this visit. General Appearance: Well nourished, in no apparent distress. Eyes: PERRLA, EOMs, conjunctiva no swelling or erythema Sinuses: No Frontal/maxillary  tenderness ENT/Mouth: Ext aud canals clear, TMs without erythema, bulging. No erythema, swelling, or exudate on post pharynx.  Tonsils not swollen or erythematous. Hearing normal.  Neck: Supple, thyroid normal.  Respiratory: Respiratory effort normal, BS equal bilaterally without rales, rhonchi, wheezing or stridor.  Cardio: RRR with no MRGs. Brisk peripheral pulses without edema.  Abdomen: Soft, + BS.  Non tender, no guarding, rebound, hernias, masses. Lymphatics: Non tender without lymphadenopathy.  Musculoskeletal: Full ROM, 5/5 strength, normal gait.  Skin: Warm, dry without rashes, lesions, ecchymosis.  Neuro: Cranial nerves intact. Normal muscle tone, no cerebellar symptoms. Sensation intact.  Psych: Awake and oriented X 3, normal affect, Insight and Judgment appropriate.     Vicie Mutters, PA-C 1:00 PM [facility]

## 2020-01-04 ENCOUNTER — Ambulatory Visit (INDEPENDENT_AMBULATORY_CARE_PROVIDER_SITE_OTHER): Payer: 59 | Admitting: Physician Assistant

## 2020-01-04 ENCOUNTER — Other Ambulatory Visit: Payer: Self-pay

## 2020-01-04 ENCOUNTER — Encounter: Payer: Self-pay | Admitting: Physician Assistant

## 2020-01-04 VITALS — BP 118/74 | HR 82 | Temp 97.5°F | Wt 148.4 lb

## 2020-01-04 DIAGNOSIS — E7841 Elevated Lipoprotein(a): Secondary | ICD-10-CM

## 2020-01-04 DIAGNOSIS — Z1152 Encounter for screening for COVID-19: Secondary | ICD-10-CM | POA: Diagnosis not present

## 2020-01-04 NOTE — Patient Instructions (Addendum)
Check out lipoprotein A, yours was 175, this was very high, with this so high I would suggest getting on a very low dose  Statin and anti inflammatory properties.   Coronavirus Antibody testing  Antibody testing are used to detect antibodies to SARS-CoV-2 virus.  Antibodies are part of the body's immune response to exposure.  Antibodies are generally detected days to weeks after infection. HOWEVER, with the novel Coronavirus the exact time it takes to develop detectable antibodies is unknown so if tested too early can result in a negative test.   This test has not been reviewed by the FDA.  - negative results do not preclude acute COVID19 infection, that is the PCR nasal swab test.  - Results from the antibody testing should NOT be used for diagnosis or exclusion of acute infection, if you have active symptoms you need the PCR nasal swab.   ALSO REMEMBER - A positive results may be due to a past or present infection with 4 other coronaviruses that are widespread and year round that are the common cold (HKU1, NL63, OC43, 229E).   Having a positive antibody test at this time does NOT mean immunity.  Having a positive antibody test, often means past infection but does NOT exclude recently infected patients who are still contagious.   So if we order this antibody test, please remember, there still come a lot of questions or possible issues with the test.  Also, it will not change any management.  We still ask that you continue to wear a mask in public or within 6 feet of someone to prevent possible spread.  We still want you to do hand washing and contact precautions.   Ask insurance and pharmacy about shingrix - new vaccine   Can go to [email or web address] for more information  Shingrix Vaccination  Two vaccines are licensed and recommended to prevent shingles in the [place] Zoster vaccine live (ZVL, Zostavax) has been in use since 2006.  Recombinant zoster vaccine (RZV, Shingrix), has been in use since 2017 and is recommended by ACIP as the preferred shingles vaccine.  What Everyone Should Know about Shingles Vaccine (Shingrix) One of the Recommended Vaccines by Disease Shingles vaccination is the only way to protect against shingles and postherpetic neuralgia (PHN), the most common complication from shingles. CDC recommends that healthy adults 50 years and older get two doses of the shingles vaccine called Shingrix (recombinant zoster vaccine), separated by 2 to 6 months, to prevent shingles and the complications from the disease. Your doctor or pharmacist can give you Shingrix as a shot in your upper arm. Shingrix provides strong protection against shingles and PHN. Two doses of Shingrix is more than 90% effective at preventing shingles and PHN. Protection stays above 85% for at least the first four years after you get vaccinated. Shingrix is the preferred vaccine, over Zostavax (zoster vaccine live), a shingles vaccine in use since 2006. Zostavax may still be used to prevent shingles in healthy adults 60 years and older. For example, you could use Zostavax if a person is allergic to Shingrix, prefers Zostavax, or requests immediate vaccination and Shingrix is unavailable. Who Should Get Shingrix? Healthy adults 50 years and older should get two doses of Shingrix, separated by 2 to 6 months. You should get Shingrix even if in the past you . had shingles  . received Zostavax  . are not sure if you had chickenpox There is no maximum age for getting Shingrix. If you had shingles in the past,  you can get Shingrix to help prevent future occurrences of the disease. There is no specific length of time that you need to wait after having shingles before you can receive Shingrix, but generally you should make sure the shingles rash has gone away before getting vaccinated. You can get Shingrix whether or not you remember having had chickenpox  in the past. Studies show that more than 99% of Americans 40 years and older have had chickenpox, even if they don't remember having the disease. Chickenpox and shingles are related because they are caused by the same virus (varicella zoster virus). After a person recovers from chickenpox, the virus stays dormant (inactive) in the body. It can reactivate years later and cause shingles. If you had Zostavax in the recent past, you should wait at least eight weeks before getting Shingrix. Talk to your healthcare provider to determine the best time to get Shingrix. Shingrix is available in [employer] and pharmacies. To find doctor's offices or pharmacies near you that offer the vaccine, visit HealthMap Vaccine FinderExternal. If you have questions about Shingrix, talk with your healthcare provider. Vaccine for Those 70 Years and Older  Shingrix reduces the risk of shingles and PHN by more than 90% in people 68 and older. CDC recommends the vaccine for healthy adults 23 and older.  Who Should Not Get Shingrix? You should not get Shingrix if you: . have ever had a severe allergic reaction to any component of the vaccine or after a dose of Shingrix  . tested negative for immunity to varicella zoster virus. If you test negative, you should get chickenpox vaccine.  . currently have shingles  . currently are pregnant or breastfeeding. Women who are pregnant or breastfeeding should wait to get Shingrix.  Marland Kitchen receive specific antiviral drugs (acyclovir, famciclovir, or valacyclovir) 24 hours before vaccination (avoid use of these antiviral drugs for 14 days after vaccination)- zoster vaccine live only If you have a minor acute (starts suddenly) illness, such as a cold, you may get Shingrix. But if you have a moderate or severe acute illness, you should usually wait until you recover before getting the vaccine. This includes anyone with a temperature of 101.57F or higher. The side effects of the Shingrix are  temporary, and usually last 2 to 3 days. While you may experience pain for a few days after getting Shingrix, the pain will be less severe than having shingles and the complications from the disease. How Well Does Shingrix Work? Two doses of Shingrix provides strong protection against shingles and postherpetic neuralgia (PHN), the most common complication of shingles. . In adults 68 to 54 years old who got two doses, Shingrix was 97% effective in preventing shingles; among adults 70 years and older, Shingrix was 91% effective.  . In adults 39 to 54 years old who got two doses, Shingrix was 91% effective in preventing PHN; among adults 70 years and older, Shingrix was 89% effective. Shingrix protection remained high (more than 85%) in people 70 years and older throughout the four years following vaccination. Since your risk of shingles and PHN increases as you get older, it is important to have strong protection against shingles in your older years. Top of Page  What Are the Possible Side Effects of Shingrix? Studies show that Shingrix is safe. The vaccine helps your body create a strong defense against shingles. As a result, you are likely to have temporary side effects from getting the shots. The side effects may affect your ability to do  normal daily activities for 2 to 3 days. Most people got a sore arm with mild or moderate pain after getting Shingrix, and some also had redness and swelling where they got the shot. Some people felt tired, had muscle pain, a headache, shivering, fever, stomach pain, or nausea. About 1 out of 6 people who got Shingrix experienced side effects that prevented them from doing regular activities. Symptoms went away on their own in about 2 to 3 days. Side effects were more common in younger people. You might have a reaction to the first or second dose of Shingrix, or both doses. If you experience side effects, you may choose to take over-the-counter pain medicine such as  ibuprofen or acetaminophen. If you experience side effects from Shingrix, you should report them to the Vaccine Adverse Event Reporting System (VAERS). Your doctor might file this report, or you can do it yourself through the VAERS websiteExternal, or by calling [phone]. If you have any questions about side effects from Shingrix, talk with your doctor. The shingles vaccine does not contain thimerosal (a preservative containing mercury). Top of Page  When Should I See a Doctor Because of the Side Effects I Experience From Shingrix? In clinical trials, Shingrix was not associated with serious adverse events. In fact, serious side effects from vaccines are extremely rare. For example, for every 1 million doses of a vaccine given, only one or two people may have a severe allergic reaction. Signs of an allergic reaction happen within minutes or hours after vaccination and include hives, swelling of the face and throat, difficulty breathing, a fast heartbeat, dizziness, or weakness. If you experience these or any other life-threatening symptoms, see a doctor right away. Shingrix causes a strong response in your immune system, so it may produce short-term side effects more intense than you are used to from other vaccines. These side effects can be uncomfortable, but they are expected and usually go away on their own in 2 or 3 days. Top of Page  How Can I Pay For Shingrix? There are several ways shingles vaccine may be paid for: Medicare . Medicare Part D plans cover the shingles vaccine, but there may be a cost to you depending on your plan. There may be a copay for the vaccine, or you may need to pay in full then get reimbursed for a certain amount.  . Medicare Part B does not cover the shingles vaccine. Medicaid . Medicaid may or may not cover the vaccine. Contact your insurer to find out. Private health insurance . Many private health insurance plans will cover the vaccine, but there may be a  cost to you depending on your plan. Contact your insurer to find out. Vaccine assistance programs . Some pharmaceutical companies provide vaccines to eligible adults who cannot afford them. You may want to check with the vaccine manufacturer, [employer], about Shingrix. If you do not currently have health insurance, learn more about affordable health coverage optionsExternal. To find doctor's offices or pharmacies near you that offer the vaccine, visit HealthMap Vaccine FinderExternal.

## 2020-01-18 ENCOUNTER — Other Ambulatory Visit: Payer: 59

## 2020-01-18 ENCOUNTER — Other Ambulatory Visit: Payer: Self-pay

## 2020-01-18 DIAGNOSIS — Z1152 Encounter for screening for COVID-19: Secondary | ICD-10-CM

## 2020-01-19 LAB — SAR COV2 SEROLOGY (COVID19)AB(IGG),IA: SARS CoV2 AB IGG: POSITIVE — AB

## 2020-03-05 NOTE — Progress Notes (Signed)
FOLLOW UP  Assessment and Plan:   Cholesterol/elevated lipoprotein A Currently above goal; has been working on lifestyle per her strong preference to avoid adding a medication  May get cardiac calcium score to see if patient would benefit from medication Lifestyle reviewed; continue citrucel, LDL goal <100  Continue low cholesterol diet and exercise.  Check lipid panel.  Vitamin D Def Below goal at last visit; newly taking 4000-5000 IU daily  continue supplementation to maintain goal of 60-100 Check Vit D level  Continue diet and meds as discussed. Further disposition pending results of labs. Discussed med's effects and SE's.   Over 15 minutes of exam, counseling, chart review, and critical decision making was performed.   Future Appointments  Date Time Provider [place]  08/31/2020  9:30 AM Vicie Mutters, PA-C [facility] None    ----------------------------------------------------------------------------------------------------------------------  HPI BP 120/80   Pulse 62   Temp (!) 97.2 F (36.2 C)   Wt 151 lb (68.5 kg)   SpO2 100%   BMI 26.33 kg/m   54 y.o. female  presents for 6 month follow up on cholesterol, weight and vitamin D deficiency.   BMI is Body mass index is 26.33 kg/m., she has been working on diet and exercise. 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  Wt Readings from Last 3 Encounters:  03/07/20 151 lb (68.5 kg)  01/04/20 148 lb 6.4 oz (67.3 kg)  12/05/19 149 lb 9.6 oz (67.9 kg)    She is not on cholesterol medication She underwent cardio IQ panel last visit which showed LDL particle number very high at 1420 Lipoprotein A 195 Adopted; family history unknown She was recommended initiation of cholesterol medication but requested trial of further lifestyle modification, has been increasing fiber, added citrucel, reducing cheese, doing more fish, "shaklee" cholesterol reduction product,  The cholesterol last visit was:    Lab Results  Component Value Date   CHOL 224 (H) 12/05/2019   HDL 62 12/05/2019   LDLCALC 137 (H) 12/05/2019   TRIG 127 12/05/2019   CHOLHDL 3.6 12/05/2019    Patient has recent history of vitamin D deficiency and is on supplement, now taking 4000 Lab Results  Component Value Date   VD25OH 38 12/05/2019       Current Medications:  Current Outpatient Medications on File Prior to Visit  Medication Sig  . CHOLECALCIFEROL PO Take 4,000 Units by mouth daily.   Current Facility-Administered Medications on File Prior to Visit  Medication  . 0.9 %  sodium chloride infusion     Allergies: No Known Allergies   Medical History:  Past Medical History:  Diagnosis Date  . Allergy    mild  . Anemia   . Anemia   . Anxiety   . 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 Basal cell carcinoma    multiple  . Chest pain   . Dermatitis 09/16/2016  . Elevated cholesterol   . Fibrocystic breast   . HPV (human papilloma virus) anogenital infection 2010  . Mixed hyperlipidemia 04/04/2014  . Sebaceous cyst    multiple  . Urticaria    Family history- Reviewed and unchanged Social history- Reviewed and unchanged   Review of Systems:  Review of Systems  Constitutional: Negative for malaise/fatigue and weight loss.  HENT: Negative for hearing loss and tinnitus.   Eyes: Negative for blurred vision and double vision.  Respiratory: Negative for cough, shortness of breath and wheezing.   Cardiovascular: Negative for  chest pain, palpitations, orthopnea, claudication and leg swelling.  Gastrointestinal: Negative for abdominal pain, blood in stool, constipation, diarrhea, heartburn, melena, nausea and vomiting.  Genitourinary: Negative.   Musculoskeletal: Negative for joint pain and myalgias.  Skin: Negative for rash.  Neurological: Negative for dizziness, tingling, sensory change, weakness and headaches.  Endo/Heme/Allergies: Negative for polydipsia.  Psychiatric/Behavioral: Negative.    All other systems reviewed and are negative.     Physical Exam: BP 120/80   Pulse 62   Temp (!) 97.2 F (36.2 C)   Wt 151 lb (68.5 kg)   SpO2 100%   BMI 26.33 kg/m  Wt Readings from Last 3 Encounters:  03/07/20 151 lb (68.5 kg)  01/04/20 148 lb 6.4 oz (67.3 kg)  12/05/19 149 lb 9.6 oz (67.9 kg)   General Appearance: Well nourished, in no apparent distress. Eyes: PERRLA, conjunctiva no swelling or erythema ENT/Mouth: mask in place; oral exam deferred. Hearing normal.  Neck: Supple Respiratory: Respiratory effort normal, BS equal bilaterally without rales, rhonchi, wheezing or stridor.  Cardio: RRR with no MRGs. Brisk peripheral pulses without edema.  Abdomen: Soft, + BS.  Non tender. Lymphatics: Non tender without lymphadenopathy.  Musculoskeletal: No obvious deformity, Normal gait Skin: Warm, dry without rashes, lesions, ecchymosis.  Neuro: Normal muscle tone Psych: Awake and oriented X 3, normal affect, Insight and Judgment appropriate.    Vicie Mutters, PA-C 9:51 AM [facility]

## 2020-03-07 ENCOUNTER — Encounter: Payer: Self-pay | Admitting: Physician Assistant

## 2020-03-07 ENCOUNTER — Ambulatory Visit (INDEPENDENT_AMBULATORY_CARE_PROVIDER_SITE_OTHER): Payer: 59 | Admitting: Physician Assistant

## 2020-03-07 ENCOUNTER — Other Ambulatory Visit: Payer: Self-pay

## 2020-03-07 VITALS — BP 120/80 | HR 62 | Temp 97.2°F | Wt 151.0 lb

## 2020-03-07 DIAGNOSIS — E559 Vitamin D deficiency, unspecified: Secondary | ICD-10-CM

## 2020-03-07 DIAGNOSIS — E782 Mixed hyperlipidemia: Secondary | ICD-10-CM | POA: Diagnosis not present

## 2020-03-07 DIAGNOSIS — E7841 Elevated Lipoprotein(a): Secondary | ICD-10-CM | POA: Diagnosis not present

## 2020-03-07 NOTE — Patient Instructions (Signed)
cardiac calcium score, this will help [place] quantify how much calcification if any you have in your coronary arteries and can help [place] decide if we need to do aggressive medical management like cholesterol medication.  It will be done at [facility] on [address] in [place] It will be 99 dollars self pay.  You can call [phone] to schedule this, just given them your name and date of birth.   Your LDL could improve, ideally we want it under a 100.  Your LDL is the bad cholesterol that can lead to heart attack and stroke. To lower your number you can decrease your fatty foods, red meat, cheese, milk and increase fiber like whole grains and veggies. You can also add a fiber supplement like Citracel or Benefiber, these do not cause gas and bloating and are safe to use. Especially if you have a strong family history of heart disease or stroke or you have evidence of plaque on any imaging like a chest xray, we may discuss at your next office visit putting you on a medication to get your number below 100.

## 2020-03-08 LAB — LIPID PANEL
Cholesterol: 223 mg/dL — ABNORMAL HIGH (ref <200)
HDL: 73 mg/dL (ref >=50)
LDL Cholesterol (Calc): 132 mg/dL (calc) — ABNORMAL HIGH
Non-HDL Cholesterol (Calc): 150 mg/dL (calc) — ABNORMAL HIGH (ref <130)
Total CHOL/HDL Ratio: 3.1 (calc) (ref <5.0)
Triglycerides: 81 mg/dL (ref <150)

## 2020-03-08 LAB — VITAMIN D 25 HYDROXY (VIT D DEFICIENCY, FRACTURES): Vit D, 25-Hydroxy: 46 ng/mL (ref 30–100)

## 2020-08-31 ENCOUNTER — Encounter: Payer: Self-pay | Admitting: Physician Assistant

## 2020-08-31 ENCOUNTER — Ambulatory Visit (INDEPENDENT_AMBULATORY_CARE_PROVIDER_SITE_OTHER): Payer: 59 | Admitting: Physician Assistant

## 2020-08-31 ENCOUNTER — Other Ambulatory Visit: Payer: Self-pay

## 2020-08-31 VITALS — BP 124/72 | HR 61 | Temp 97.7°F | Ht 64.0 in | Wt 151.0 lb

## 2020-08-31 DIAGNOSIS — Z79899 Other long term (current) drug therapy: Secondary | ICD-10-CM

## 2020-08-31 DIAGNOSIS — M25551 Pain in right hip: Secondary | ICD-10-CM

## 2020-08-31 DIAGNOSIS — M25552 Pain in left hip: Secondary | ICD-10-CM

## 2020-08-31 DIAGNOSIS — Z1389 Encounter for screening for other disorder: Secondary | ICD-10-CM

## 2020-08-31 DIAGNOSIS — E559 Vitamin D deficiency, unspecified: Secondary | ICD-10-CM

## 2020-08-31 DIAGNOSIS — Z1329 Encounter for screening for other suspected endocrine disorder: Secondary | ICD-10-CM

## 2020-08-31 DIAGNOSIS — Z0000 Encounter for general adult medical examination without abnormal findings: Secondary | ICD-10-CM | POA: Diagnosis not present

## 2020-08-31 DIAGNOSIS — E7841 Elevated Lipoprotein(a): Secondary | ICD-10-CM

## 2020-08-31 DIAGNOSIS — E782 Mixed hyperlipidemia: Secondary | ICD-10-CM

## 2020-08-31 NOTE — Progress Notes (Signed)
Complete Physical  Assessment and Plan: Encounter for general adult medical examination with abnormal findings 1 year  Anemia, unspecified type -     CBC with Differential/Platelet  Mixed hyperlipidemia -     Not on medication - elevated lipoprotein A, will get cardiac calcium score to see if needs to get on cholesterol medication  Atheroma, skin Monitor  Perennial allergic rhinitis Get on allergy pill  Vitamin D deficiency -     VITAMIN D 25 Hydroxy (Vit-D Deficiency, Fractures)  Medication management -     CBC with Differential/Platelet -     COMPLETE METABOLIC PANEL WITH GFR -     Magnesium  Screening for thyroid disorder -     TSH  Screening for hematuria or proteinuria -     Urinalysis, Routine w reflex microscopic -     Microalbumin / creatinine urine ratio  Discussed med's effects and SE's. Screening labs and tests as requested with regular follow-up as recommended. Over 40 minutes of exam, counseling, chart review, and complex, high level critical decision making was performed this visit.  Future Appointments  Date Time Provider [place]  09/05/2021  9:00 AM Liane Comber, NP [facility] None    HPI  54 y.o. DWF female  presents for a complete physical and follow up for has Palpitations; Mixed hyperlipidemia; Atheroma, skin; Urticaria; Perennial allergic rhinitis; Medication management; Vitamin D deficiency; and Elevated lipoprotein(a) on their problem list..  Her blood pressure has been controlled at home, today their BP is BP: 124/72 She does workout. She denies chest pain, shortness of breath, dizziness.   She states she has hip stiffness/pain, about 30 mins in the AM, better with walking. She will have some ankle pain/heel.  Started to take collagen 2-3 weeks ago. She is not on tumeric. Was adopted, unknown family history.  Her sleep has been off for several months. Can initiate sleep but will wake up at 2-3 am. Has been under stress with her  mom having heart issues.     She is still on her BCP, would like to come off eventually, following with Dr. Melba Coon, OB/GYN.   She is on cholesterol medication and denies myalgias.  Her cholesterol is not at goal. She has elevated LPa.  Non smoker. Was adopted, unknown family history.  The cholesterol last visit was:   Lab Results  Component Value Date   CHOL 223 (H) 03/07/2020   HDL 73 03/07/2020   LDLCALC 132 (H) 03/07/2020   TRIG 81 03/07/2020   CHOLHDL 3.1 03/07/2020    Last A1C in the office was:  Lab Results  Component Value Date   HGBA1C 5.3 05/07/2016   Patient is on Vitamin D supplement.   Lab Results  Component Value Date   VD25OH 46 03/07/2020     BMI is Body mass index is 25.92 kg/m., she is working on diet and exercise. Wt Readings from Last 3 Encounters:  08/31/20 151 lb (68.5 kg)  03/07/20 151 lb (68.5 kg)  01/04/20 148 lb 6.4 oz (67.3 kg)    Current Medications:  Current Outpatient Medications on File Prior to Visit  Medication Sig Dispense Refill  . CHOLECALCIFEROL PO Take 4,000 Units by mouth daily.     Current Facility-Administered Medications on File Prior to Visit  Medication Dose Route Frequency Provider Last Rate Last Admin  . 0.9 %  sodium chloride infusion  500 mL Intravenous Continuous Doran Stabler, MD       Allergies:  No Known  Allergies   Medical History:  She has Palpitations; Mixed hyperlipidemia; Atheroma, skin; Urticaria; Perennial allergic rhinitis; Medication management; Vitamin D deficiency; and Elevated lipoprotein(a) on their problem list.   Health Maintenance:   Immunization History  Administered Date(s) Administered  . DTaP 09/14/2009  . PPD Test 11/14/2013  . Pneumococcal-Unspecified 01/16/1998  . Td 02/06/1995  . Tdap 09/01/2019   Health Maintenance  Topic Date Due  . Hepatitis C Screening  Never done  . COVID-19 Vaccine (1) Never done  . HIV Screening  Never done  . INFLUENZA VACCINE  Never done  .  MAMMOGRAM  10/31/2021  . PAP SMEAR-Modifier  10/31/2022  . COLONOSCOPY  09/18/2027  . TETANUS/TDAP  08/31/2029    Tetanus: 2010 DUE Pneumovax:1999 Prevnar 13:  Flu vaccine: Zostavax:  Pap: Dr. Roxy Horseman negative MGM: 10/2019 at [facility]YN DEXA: N/A Colonoscopy: 2018 Dr. Loletha Carrow, 10 years.  EGD: N/A  Patient Care Team: Unk Pinto, MD as PCP - General (Internal Medicine)  Surgical History:  She has a past surgical history that includes Excision basal cell carcinoma; Dilatation & currettage/hysteroscopy with versapoint resection (N/A, 07/29/2013); Novasure ablation (N/A, 07/29/2013); and Wisdom tooth extraction. Family History:  Herfamily history is not on file. She was adopted. Social History:  She reports that she has never smoked. She has never used smokeless tobacco. She reports current alcohol use. She reports that she does not use drugs.  Review of Systems: Review of Systems  Constitutional: Negative.   HENT: Negative.   Eyes: Negative.   Respiratory: Negative.   Cardiovascular: Negative.   Gastrointestinal: Negative.   Genitourinary: Negative.   Musculoskeletal: Negative.   Skin: Negative.   Neurological: Negative.   Endo/Heme/Allergies: Negative.   Psychiatric/Behavioral: Negative.     Physical Exam: Estimated body mass index is 25.92 kg/m as calculated from the following:   Height as of this encounter: 5\' 4"  (1.626 m).   Weight as of this encounter: 151 lb (68.5 kg). BP 124/72   Pulse 61   Temp 97.7 F (36.5 C)   Ht 5\' 4"  (1.626 m)   Wt 151 lb (68.5 kg)   SpO2 97%   BMI 25.92 kg/m  General Appearance: Well nourished, in no apparent distress.  Eyes: PERRLA, EOMs, conjunctiva no swelling or erythema, normal fundi and vessels.  Sinuses: No Frontal/maxillary tenderness  ENT/Mouth: Ext aud canals clear, normal light reflex with TMs without erythema, bulging. Good dentition. No erythema, swelling, or exudate on post pharynx. Tonsils not swollen or erythematous.  Hearing normal.  Neck: Supple, thyroid normal. No bruits  Respiratory: Respiratory effort normal, BS equal bilaterally without rales, rhonchi, wheezing or stridor.  Cardio: RRR without murmurs, rubs or gallops. Brisk peripheral pulses without edema.  Chest: symmetric, with normal excursions and percussion.  Breasts: defer Abdomen: Soft, nontender, no guarding, rebound, hernias, masses, or organomegaly.  Lymphatics: Non tender without lymphadenopathy.  Genitourinary: defer Musculoskeletal: Full ROM all peripheral extremities,5/5 strength, and normal gait.  Skin: Warm, dry without rashes, lesions, ecchymosis. Neuro: Cranial nerves intact, reflexes equal bilaterally. Normal muscle tone, no cerebellar symptoms. Sensation intact.  Psych: Awake and oriented X 3, normal affect, Insight and Judgment appropriate.   EKG: defer get next year no symptoms AORTA SCAN: declines   Vicie Mutters 10:12 AM [facility]

## 2020-08-31 NOTE — Patient Instructions (Signed)
I'm going to place an order for a cardiac calcium score, this will help [place] quantify how much calcification if any you have in your coronary arteries and can help [place] decide if we need to do aggressive medical management like cholesterol medication.  It will be done at [facility] on [address] in [place] It will be 99 dollars self pay.   GENERAL HEALTH GOALS  Know what a healthy weight is for you (roughly BMI <25) and aim to maintain this  Aim for 7+ servings of fruits and vegetables daily  70-80+ fluid ounces of water or unsweet tea for healthy kidneys  Limit to max 1 drink of alcohol per day; avoid smoking/tobacco  Limit animal fats in diet for cholesterol and heart health - choose grass fed whenever available  Avoid highly processed foods, and foods high in saturated/trans fats  Aim for low stress - take time to unwind and care for your mental health  Aim for 150 min of moderate intensity exercise weekly for heart health, and weights twice weekly for bone health  Aim for 7-9 hours of sleep daily

## 2020-09-01 LAB — COMPLETE METABOLIC PANEL WITH GFR
AG Ratio: 1.8 (calc) (ref 1.0–2.5)
ALT: 12 U/L (ref 6–29)
AST: 13 U/L (ref 10–35)
Albumin: 4.5 g/dL (ref 3.6–5.1)
Alkaline phosphatase (APISO): 77 U/L (ref 37–153)
BUN: 21 mg/dL (ref 7–25)
CO2: 27 mmol/L (ref 20–32)
Calcium: 9.4 mg/dL (ref 8.6–10.4)
Chloride: 103 mmol/L (ref 98–110)
Creat: 0.68 mg/dL (ref 0.50–1.05)
GFR, Est African American: 116 mL/min/{1.73_m2} (ref >=60)
GFR, Est Non African American: 100 mL/min/{1.73_m2} (ref >=60)
Globulin: 2.5 g/dL (calc) (ref 1.9–3.7)
Glucose, Bld: 82 mg/dL (ref 65–99)
Potassium: 4.2 mmol/L (ref 3.5–5.3)
Sodium: 140 mmol/L (ref 135–146)
Total Bilirubin: 0.5 mg/dL (ref 0.2–1.2)
Total Protein: 7 g/dL (ref 6.1–8.1)

## 2020-09-01 LAB — LIPID PANEL
Cholesterol: 215 mg/dL — ABNORMAL HIGH (ref <200)
HDL: 74 mg/dL (ref >=50)
LDL Cholesterol (Calc): 118 mg/dL (calc) — ABNORMAL HIGH
Non-HDL Cholesterol (Calc): 141 mg/dL (calc) — ABNORMAL HIGH (ref <130)
Total CHOL/HDL Ratio: 2.9 (calc) (ref <5.0)
Triglycerides: 119 mg/dL (ref <150)

## 2020-09-01 LAB — CBC WITH DIFFERENTIAL/PLATELET
Absolute Monocytes: 454 cells/uL (ref 200–950)
Basophils Absolute: 50 cells/uL (ref 0–200)
Basophils Relative: 0.8 %
Eosinophils Absolute: 473 cells/uL (ref 15–500)
Eosinophils Relative: 7.5 %
HCT: 40.6 % (ref 35.0–45.0)
Hemoglobin: 13.5 g/dL (ref 11.7–15.5)
Lymphs Abs: 2476 cells/uL (ref 850–3900)
MCH: 29.2 pg (ref 27.0–33.0)
MCHC: 33.3 g/dL (ref 32.0–36.0)
MCV: 87.9 fL (ref 80.0–100.0)
MPV: 10.3 fL (ref 7.5–12.5)
Monocytes Relative: 7.2 %
Neutro Abs: 2848 cells/uL (ref 1500–7800)
Neutrophils Relative %: 45.2 %
Platelets: 226 10*3/uL (ref 140–400)
RBC: 4.62 10*6/uL (ref 3.80–5.10)
RDW: 12.2 % (ref 11.0–15.0)
Total Lymphocyte: 39.3 %
WBC: 6.3 10*3/uL (ref 3.8–10.8)

## 2020-09-01 LAB — SEDIMENTATION RATE: Sed Rate: 2 mm/h (ref 0–30)

## 2020-09-01 LAB — URINALYSIS, ROUTINE W REFLEX MICROSCOPIC
Bilirubin Urine: NEGATIVE
Glucose, UA: NEGATIVE
Hgb urine dipstick: NEGATIVE
Ketones, ur: NEGATIVE
Leukocytes,Ua: NEGATIVE
Nitrite: NEGATIVE
Protein, ur: NEGATIVE
Specific Gravity, Urine: 1.016 (ref 1.001–1.03)
pH: 7 (ref 5.0–8.0)

## 2020-09-01 LAB — MICROALBUMIN / CREATININE URINE RATIO
Creatinine, Urine: 46 mg/dL (ref 20–275)
Microalb Creat Ratio: 9 mcg/mg creat (ref <30)
Microalb, Ur: 0.4 mg/dL

## 2020-09-01 LAB — TSH: TSH: 1.61 mIU/L

## 2020-09-01 LAB — MAGNESIUM: Magnesium: 2 mg/dL (ref 1.5–2.5)

## 2020-09-01 LAB — VITAMIN D 25 HYDROXY (VIT D DEFICIENCY, FRACTURES): Vit D, 25-Hydroxy: 39 ng/mL (ref 30–100)

## 2020-09-01 LAB — C-REACTIVE PROTEIN: CRP: 0.6 mg/L (ref <8.0)

## 2020-09-17 ENCOUNTER — Encounter: Payer: Self-pay | Admitting: Physician Assistant

## 2020-10-01 ENCOUNTER — Ambulatory Visit (INDEPENDENT_AMBULATORY_CARE_PROVIDER_SITE_OTHER): Payer: 59 | Admitting: Adult Health Nurse Practitioner

## 2020-10-01 ENCOUNTER — Other Ambulatory Visit: Payer: Self-pay

## 2020-10-01 ENCOUNTER — Encounter: Payer: Self-pay | Admitting: Adult Health Nurse Practitioner

## 2020-10-01 VITALS — BP 116/80 | HR 73 | Temp 97.6°F | Wt 153.0 lb

## 2020-10-01 DIAGNOSIS — M7742 Metatarsalgia, left foot: Secondary | ICD-10-CM | POA: Diagnosis not present

## 2020-10-01 DIAGNOSIS — M79672 Pain in left foot: Secondary | ICD-10-CM | POA: Diagnosis not present

## 2020-10-01 MED ORDER — MELOXICAM 15 MG PO TABS: ORAL_TABLET | ORAL | 1 refills | Status: DC

## 2020-10-01 NOTE — Progress Notes (Signed)
Assessment and Plan:  Gabriela Ramirez was seen today for acute visit.  Diagnoses and all orders for this visit:  Left foot pain Discussed multiple etiologies -     CBC with Differential/Platelet -     COMPLETE METABOLIC PANEL WITH GFR -     Uric acid  Metatarsalgia of left foot -     Ambulatory referral to Orthopedics -     meloxicam (MOBIC) 15 MG tablet; Take one tablet by mouth daily for inflammation.   Continue to monitor area, contact office with amy new or worsening symptoms.    Further disposition pending results of labs. Discussed med's effects and SE's.   Over 30 minutes of face to face interview, exam, counseling, chart review, and critical decision making was performed.   Future Appointments  Date Time Provider [place]  09/05/2021  9:00 AM Liane Comber, NP [facility] None    ------------------------------------------------------------------------------------------------------------------   HPI 54 y.o.female presents for evaluation of foot pain.  Reports that it started out as a bump under her toe on bottom side of foot that was non-painful.  She reports that four days ago it starting hurting more and she noted that she was walking on the lateral aspect of her foot.  Today her foot is swollen and has been since Thursday.  She reports there is minimal improvement in swelling when she sleeps at night.  She does report some warmness to her foot also.  She reports she has taken ibuprofen 200mg  twice a day for the past three days.  She has also soaked in epsom salts.  She also tried ice.  She takes tumeric supplement regularly as well.   [employer] is preference.      Past Medical History:  Diagnosis Date  . Allergy    mild  . Anemia   . Anemia   . Anxiety   . 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 Basal cell carcinoma    multiple  . Chest pain   . Dermatitis 09/16/2016  . Elevated cholesterol   . Fibrocystic breast   . HPV (human papilloma  virus) anogenital infection 2010  . Mixed hyperlipidemia 04/04/2014  . Sebaceous cyst    multiple  . Urticaria      No Known Allergies  Current Outpatient Medications on File Prior to Visit  Medication Sig  . CHOLECALCIFEROL PO Take 4,000 Units by mouth daily.   Current Facility-Administered Medications on File Prior to Visit  Medication  . 0.9 %  sodium chloride infusion    ROS: all negative except above.   Physical Exam:  BP 116/80   Pulse 73   Temp 97.6 F (36.4 C)   Wt 153 lb (69.4 kg)   SpO2 99%   BMI 26.26 kg/m   General Appearance: Well nourished, in no apparent distress. Eyes: PERRLA, EOMs, conjunctiva no swelling or erythema Sinuses: No Frontal/maxillary tenderness ENT/Mouth: Ext aud canals clear, TMs without erythema, bulging. No erythema, swelling, or exudate on post pharynx.  Tonsils not swollen or erythematous. Hearing normal.  Neck: Supple, thyroid normal.  Respiratory: Respiratory effort normal, BS equal bilaterally without rales, rhonchi, wheezing or stridor.  Cardio: RRR with no MRGs. Brisk peripheral pulses without edema.  Abdomen: Soft, + BS.  Non tender, no guarding, rebound, hernias, masses. Lymphatics: Non tender without lymphadenopathy.  Musculoskeletal: Full ROM, 5/5 strength, normal gait.  Skin: Warm, dry without rashes, lesions, ecchymosis.  Neuro: Cranial nerves intact. Normal muscle tone, no cerebellar symptoms. Sensation intact.  Psych: Awake and oriented X 3, normal affect, Insight and Judgment appropriate.     Garnet Sierras, NP 4:11 PM [facility]

## 2020-10-01 NOTE — Patient Instructions (Signed)
Today we placed a referral to orthopedics:  We will contact you via MyChart with your lab results.   Treatment may include:  Rest, ice, compression, and elevation. This is often called the RICE strategy. It may be recommended if the injury is mild.  Restricting movement of your toe by taping it to the smaller toes.  Wearing a walking boot or a cast. This will keep your toe from moving (immobilization).  Over-the-counter medicines to relieve pain.  Physical therapy.  Surgery. In rare cases, surgery may be needed for a severe injury if pain does not go away.   We will send in meloxicam, antiinflammatory for you to take once a day.  Take this with food to avoid stomach upset.

## 2020-10-02 LAB — COMPLETE METABOLIC PANEL WITH GFR
AG Ratio: 1.6 (calc) (ref 1.0–2.5)
ALT: 14 U/L (ref 6–29)
AST: 16 U/L (ref 10–35)
Albumin: 4.5 g/dL (ref 3.6–5.1)
Alkaline phosphatase (APISO): 79 U/L (ref 37–153)
BUN: 17 mg/dL (ref 7–25)
CO2: 29 mmol/L (ref 20–32)
Calcium: 9.7 mg/dL (ref 8.6–10.4)
Chloride: 101 mmol/L (ref 98–110)
Creat: 0.7 mg/dL (ref 0.50–1.05)
GFR, Est African American: 115 mL/min/{1.73_m2} (ref >=60)
GFR, Est Non African American: 99 mL/min/{1.73_m2} (ref >=60)
Globulin: 2.9 g/dL (calc) (ref 1.9–3.7)
Glucose, Bld: 94 mg/dL (ref 65–99)
Potassium: 3.9 mmol/L (ref 3.5–5.3)
Sodium: 139 mmol/L (ref 135–146)
Total Bilirubin: 0.5 mg/dL (ref 0.2–1.2)
Total Protein: 7.4 g/dL (ref 6.1–8.1)

## 2020-10-02 LAB — CBC WITH DIFFERENTIAL/PLATELET
Absolute Monocytes: 496 cells/uL (ref 200–950)
Basophils Absolute: 54 cells/uL (ref 0–200)
Basophils Relative: 0.8 %
Eosinophils Absolute: 275 cells/uL (ref 15–500)
Eosinophils Relative: 4.1 %
HCT: 41 % (ref 35.0–45.0)
Hemoglobin: 13.2 g/dL (ref 11.7–15.5)
Lymphs Abs: 2754 cells/uL (ref 850–3900)
MCH: 28 pg (ref 27.0–33.0)
MCHC: 32.2 g/dL (ref 32.0–36.0)
MCV: 86.9 fL (ref 80.0–100.0)
MPV: 10.2 fL (ref 7.5–12.5)
Monocytes Relative: 7.4 %
Neutro Abs: 3122 cells/uL (ref 1500–7800)
Neutrophils Relative %: 46.6 %
Platelets: 263 10*3/uL (ref 140–400)
RBC: 4.72 10*6/uL (ref 3.80–5.10)
RDW: 12.2 % (ref 11.0–15.0)
Total Lymphocyte: 41.1 %
WBC: 6.7 10*3/uL (ref 3.8–10.8)

## 2020-10-02 LAB — URIC ACID: Uric Acid, Serum: 2.8 mg/dL (ref 2.5–7.0)

## 2020-11-15 LAB — HM MAMMOGRAPHY

## 2021-09-04 NOTE — Progress Notes (Signed)
Complete Physical  Assessment and Plan:  Encounter for Annual Physical Exam with abnormal findings Due annually  Health Maintenance reviewed Healthy lifestyle reviewed and goals set  Elevated lipoprotein(a) - discussed familial, future risk concerns - recent CT coronary calcium score of 0 - deferring lipid treatments for now, consider repeat CT coronary calcium in 5-10 years for trend - heart healthy diet, regular exercise, healthy weight encouraged  Atheroma, skin Monitor  Perennial allergic rhinitis Continue H2i PRN  History of BCC Encourage sunscreen, derm follows closely   Vitamin D deficiency -     VITAMIN D 25 Hydroxy (Vit-D Deficiency, Fractures)  Medication management -     CBC with Differential/Platelet -     COMPLETE METABOLIC PANEL WITH GFR -     Magnesium  Screening for thyroid disorder -     TSH  Screening diabetes       -     A1C  Screening for hematuria or proteinuria -     Urinalysis, Routine w reflex microscopic  Fatigue - Check B12, CBC, TSH  BMI 25 Long discussion about weight loss, diet, and exercise Recommended diet heavy in fruits and veggies and low in animal meats, cheeses, and dairy products, appropriate calorie intake Discussed appropriate weight for height  Follow up at next visit  Left shoulder pain Suspect for impingement, working with PT for exercises Given meloxicam 15 mg 2 weeks then PRN Regular gentle ROM exercises, ice and topical voltaren  Orders Placed This Encounter  Procedures   CBC with Differential/Platelet   COMPLETE METABOLIC PANEL WITH GFR   Lipid panel   TSH   Hemoglobin A1c   VITAMIN D 25 Hydroxy (Vit-D Deficiency, Fractures)   Urinalysis, Routine w reflex microscopic   Vitamin B12    Discussed med's effects and SE's. Screening labs and tests as requested with regular follow-up as recommended. Over 40 minutes of exam, counseling, chart review, and complex, high level critical decision making was performed  this visit.  Future Appointments  Date Time Provider [place]  09/05/2022  9:00 AM Liane Comber, NP [facility] None    HPI  55 y.o. DWF female  presents for a complete physical and follow up for has Palpitations; Mixed hyperlipidemia; Atheroma, skin; Urticaria; Perennial allergic rhinitis; Medication management; Vitamin D deficiency; Elevated lipoprotein(a); and History of basal cell cancer on their problem list.  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 Was adopted, unknown family history. She is dating, 2 children, 43 y/o daughter and 47 y/o son - studying to be a Insurance underwriter.   She is following annually with Dr. Melba Coon for PAP/mammo, postmenopausal s/p ablation, [facility].   She has intermittent joint stiffness/soreness, has left shoulder possible impingement, working with a Physiological scientist, stretching exercises, being very careful.   Hx of BCC followed by Dr. Delman Cheadle.   BMI is Body mass index is 26.29 kg/m., she has been working on diet and exercise. She is working with a Physiological scientist. She admits struggles with veggie texture, does smoothie in AM with fruit ad greens.  Caffeine - 1 coffee in AM, 1 small diet mountain dew in AM Water - 64 fluid ounces  Wt Readings from Last 3 Encounters:  09/05/21 156 lb 12.8 oz (71.1 kg)  10/01/20 153 lb (69.4 kg)  08/31/20 151 lb (68.5 kg)   Her blood pressure has been controlled at home, today their BP is BP: 122/76 She does workout. She denies chest pain, shortness of breath, dizziness.  She is not on cholesterol medication and denies myalgias.  Her cholesterol is not at goal. She has elevated LP(a) 195 in 08/2019.  Non smoker. Was adopted, unknown family history.  Had CT coronary calcium score of 0 on 09/12/2020.  The cholesterol last visit was:   Lab Results  Component Value Date   CHOL 215 (H) 08/31/2020   HDL 74 08/31/2020   LDLCALC 118 (H) 08/31/2020   TRIG 119 08/31/2020   CHOLHDL 2.9  08/31/2020   Last A1C in the office was:  Lab Results  Component Value Date   HGBA1C 5.3 05/07/2016   GFR Lab Results  Component Value Date   GFRNONAA 99 10/01/2020   Patient is on Vitamin D supplement., 5000 IU daily.  Lab Results  Component Value Date   VD25OH 39 08/31/2020       Current Medications:  Current Outpatient Medications on File Prior to Visit  Medication Sig Dispense Refill   CHOLECALCIFEROL PO Take 4,000 Units by mouth daily.     Omega-3 Fatty Acids (OMEGA-3 PO) Take by mouth daily.     VITAMIN D PO Take by mouth daily.     meloxicam (MOBIC) 15 MG tablet Take one tablet by mouth daily for inflammation. 30 tablet 1   Current Facility-Administered Medications on File Prior to Visit  Medication Dose Route Frequency Provider Last Rate Last Admin   0.9 %  sodium chloride infusion  500 mL Intravenous Continuous Danis, Estill Cotta III, MD       Allergies:  No Known Allergies   Medical History:  She has Palpitations; Mixed hyperlipidemia; Atheroma, skin; Urticaria; Perennial allergic rhinitis; Medication management; Vitamin D deficiency; Elevated lipoprotein(a); and History of basal cell cancer on their problem list.   Health Maintenance:   Immunization History  Administered Date(s) Administered   DTaP 09/14/2009   PPD Test 11/14/2013   Pneumococcal-Unspecified 01/16/1998   Td 02/06/1995   Tdap 09/01/2019   Tetanus: 2020 Pneumovax:1999 Prevnar 13:  Flu vaccine: declines  Shingrix: considering - will check with insurance Covid 19: has had 2/2 pfizer -   Pap: Dr. Roxy Horseman negative, last reported  MGM: 2021 at GYN - has upcoming scheduled DEXA: N/A  Colonoscopy: 2018 Dr. Loletha Carrow, 10 years.  EGD: N/A  Patient Care Team: Unk Pinto, MD as PCP - General (Internal Medicine) Loletha Carrow Kirke Corin, MD as Consulting Physician (Gastroenterology)  Surgical History:  She has a past surgical history that includes Excision basal cell carcinoma; Dilatation &  currettage/hysteroscopy with versapoint resection (N/A, 07/29/2013); Novasure ablation (N/A, 07/29/2013); and Wisdom tooth extraction. Family History:  Herfamily history is not on file. She was adopted. Social History:  She reports that she has never smoked. She has never used smokeless tobacco. She reports that she does not currently use alcohol. She reports that she does not use drugs.  Review of Systems: Review of Systems  Constitutional: Negative.  Negative for malaise/fatigue and weight loss.  HENT: Negative.  Negative for hearing loss and tinnitus.   Eyes: Negative.  Negative for blurred vision and double vision.  Respiratory: Negative.  Negative for cough, shortness of breath and wheezing.   Cardiovascular: Negative.  Negative for chest pain, palpitations, orthopnea, claudication and leg swelling.  Gastrointestinal: Negative.  Negative for abdominal pain, blood in stool, constipation, diarrhea, heartburn, melena, nausea and vomiting.  Genitourinary: Negative.   Musculoskeletal:  Positive for joint pain (left shoulder, mild, intermittent). Negative for myalgias.  Skin: Negative.  Negative for rash.  Neurological: Negative.  Negative  for dizziness, tingling, sensory change, weakness and headaches.  Endo/Heme/Allergies: Negative.  Negative for polydipsia.  Psychiatric/Behavioral: Negative.    All other systems reviewed and are negative.  Physical Exam: Estimated body mass index is 26.29 kg/m as calculated from the following:   Height as of this encounter: 5' 4.75" (1.645 m).   Weight as of this encounter: 156 lb 12.8 oz (71.1 kg). BP 122/76   Pulse 61   Temp (!) 97.5 F (36.4 C)   Ht 5' 4.75" (1.645 m)   Wt 156 lb 12.8 oz (71.1 kg)   SpO2 99%   BMI 26.29 kg/m  General Appearance: Well nourished, in no apparent distress.  Eyes: PERRLA, EOMs, conjunctiva no swelling or erythema, normal fundi and vessels.  Sinuses: No Frontal/maxillary tenderness  ENT/Mouth: Ext aud canals  clear, normal light reflex with TMs without erythema, bulging. Good dentition. No erythema, swelling, or exudate on post pharynx. Tonsils not swollen or erythematous. Hearing normal.  Neck: Supple, thyroid normal. No bruits  Respiratory: Respiratory effort normal, BS equal bilaterally without rales, rhonchi, wheezing or stridor.  Cardio: RRR without murmurs, rubs or gallops. Brisk peripheral pulses without edema.  Chest: symmetric, with normal excursions and percussion.  Breasts: defer to GYN Abdomen: Soft, nontender, no guarding, rebound, hernias, masses, or organomegaly.  Lymphatics: Non tender without lymphadenopathy.  Genitourinary: defer to GYN Musculoskeletal: Full ROM all peripheral extremities,5/5 strength, and normal gait. She has some discomfort with L shoulder abduction against resistance past 90 degrees.  Skin: Warm, dry without rashes, lesions, ecchymosis. Neuro: Cranial nerves intact, reflexes equal bilaterally. Normal muscle tone, no cerebellar symptoms. Sensation intact.  Psych: Awake and oriented X 3, normal affect, Insight and Judgment appropriate.   EKG: defer, no sx AORTA SCAN: declines   [place] 9:22 AM [facility]

## 2021-09-05 ENCOUNTER — Other Ambulatory Visit: Payer: Self-pay

## 2021-09-05 ENCOUNTER — Encounter: Payer: Self-pay | Admitting: Adult Health

## 2021-09-05 ENCOUNTER — Ambulatory Visit (INDEPENDENT_AMBULATORY_CARE_PROVIDER_SITE_OTHER): Payer: 59 | Admitting: Adult Health

## 2021-09-05 VITALS — BP 122/76 | HR 61 | Temp 97.5°F | Ht 64.75 in | Wt 156.8 lb

## 2021-09-05 DIAGNOSIS — R002 Palpitations: Secondary | ICD-10-CM

## 2021-09-05 DIAGNOSIS — Z85828 Personal history of other malignant neoplasm of skin: Secondary | ICD-10-CM | POA: Insufficient documentation

## 2021-09-05 DIAGNOSIS — Z0000 Encounter for general adult medical examination without abnormal findings: Secondary | ICD-10-CM

## 2021-09-05 DIAGNOSIS — E538 Deficiency of other specified B group vitamins: Secondary | ICD-10-CM

## 2021-09-05 DIAGNOSIS — E782 Mixed hyperlipidemia: Secondary | ICD-10-CM

## 2021-09-05 DIAGNOSIS — E7841 Elevated Lipoprotein(a): Secondary | ICD-10-CM

## 2021-09-05 DIAGNOSIS — G8929 Other chronic pain: Secondary | ICD-10-CM

## 2021-09-05 DIAGNOSIS — Z6825 Body mass index (BMI) 25.0-25.9, adult: Secondary | ICD-10-CM

## 2021-09-05 DIAGNOSIS — Z0001 Encounter for general adult medical examination with abnormal findings: Secondary | ICD-10-CM

## 2021-09-05 DIAGNOSIS — Z131 Encounter for screening for diabetes mellitus: Secondary | ICD-10-CM

## 2021-09-05 DIAGNOSIS — E559 Vitamin D deficiency, unspecified: Secondary | ICD-10-CM

## 2021-09-05 DIAGNOSIS — Z79899 Other long term (current) drug therapy: Secondary | ICD-10-CM

## 2021-09-05 DIAGNOSIS — Z1329 Encounter for screening for other suspected endocrine disorder: Secondary | ICD-10-CM

## 2021-09-05 DIAGNOSIS — M7742 Metatarsalgia, left foot: Secondary | ICD-10-CM

## 2021-09-05 DIAGNOSIS — Z1389 Encounter for screening for other disorder: Secondary | ICD-10-CM

## 2021-09-05 DIAGNOSIS — R5383 Other fatigue: Secondary | ICD-10-CM

## 2021-09-05 MED ORDER — MELOXICAM 15 MG PO TABS: ORAL_TABLET | ORAL | 1 refills | Status: DC

## 2021-09-05 NOTE — Patient Instructions (Addendum)
Gabriela Ramirez , Thank you for taking time to come for your Medicare Wellness Visit. I appreciate your ongoing commitment to your health goals. Please review the following plan we discussed and let me know if I can assist you in the future.   These are the goals we discussed:  Goals      DIET - EAT MORE FRUITS AND VEGETABLES     Fiber goal 25-30+ grams/day        This is a list of the screening recommended for you and due dates:  Health Maintenance  Topic Date Due   COVID-19 Vaccine (1) Never done   Flu Shot  09/28/2021*   Zoster (Shingles) Vaccine (1 of 2) 12/05/2021*   Mammogram  10/31/2021   Pap Smear  10/31/2022   Colon Cancer Screening  09/18/2027   Tetanus Vaccine  08/31/2029   HPV Vaccine  Aged Out   Hepatitis C Screening: USPSTF Recommendation to screen - Ages 18-79 yo.  Discontinued   HIV Screening  Discontinued  *Topic was postponed. The date shown is not the original due date.    Please send covid 19 vaccine dates  Recommend getting flu vaccine mid October       Know what a healthy weight is for you (roughly BMI <25) and aim to maintain this  Aim for 7+ servings of fruits and vegetables daily  65-80+ fluid ounces of water or unsweet tea for healthy kidneys  Limit to max 1 drink of alcohol per day; avoid smoking/tobacco  Limit animal fats in diet for cholesterol and heart health - choose grass fed whenever available  Avoid highly processed foods, and foods high in saturated/trans fats  Aim for low stress - take time to unwind and care for your mental health  Aim for 150 min of moderate intensity exercise weekly for heart health, and weights twice weekly for bone health  Aim for 7-9 hours of sleep daily   High-Fiber Eating Plan Fiber, also called dietary fiber, is a type of carbohydrate. It is found foods such as fruits, vegetables, whole grains, and beans. A high-fiber diet can have many health benefits. Your health care provider may recommend a  high-fiber diet to help: Prevent constipation. Fiber can make your bowel movements more regular. Lower your cholesterol. Relieve the following conditions: Inflammation of veins in the anus (hemorrhoids). Inflammation of specific areas of the digestive tract (uncomplicated diverticulosis). A problem of the large intestine, also called the colon, that sometimes causes pain and diarrhea (irritable bowel syndrome, or IBS). Prevent overeating as part of a weight-loss plan. Prevent heart disease, type 2 diabetes, and certain cancers. What are tips for following this plan? Reading food labels  Check the nutrition facts label on food products for the amount of dietary fiber. Choose foods that have 5 grams of fiber or more per serving. The goals for recommended daily fiber intake include: Men (age 36 or younger): 34-38 g. Men (over age 82): 28-34 g. Women (age 82 or younger): 25-28 g. Women (over age 65): 22-25 g. Your daily fiber goal is _____________ g. Shopping Choose whole fruits and vegetables instead of processed forms, such as apple juice or applesauce. Choose a wide variety of high-fiber foods such as avocados, lentils, oats, and kidney beans. Read the nutrition facts label of the foods you choose. Be aware of foods with added fiber. These foods often have high sugar and sodium amounts per serving. Cooking Use whole-grain flour for baking and cooking. Cook with brown rice instead  of white rice. Meal planning Start the day with a breakfast that is high in fiber, such as a cereal that contains 5 g of fiber or more per serving. Eat breads and cereals that are made with whole-grain flour instead of refined flour or white flour. Eat brown rice, bulgur wheat, or millet instead of white rice. Use beans in place of meat in soups, salads, and pasta dishes. Be sure that half of the grains you eat each day are whole grains. General information You can get the recommended daily intake of dietary  fiber by: Eating a variety of fruits, vegetables, grains, nuts, and beans. Taking a fiber supplement if you are not able to take in enough fiber in your diet. It is better to get fiber through food than from a supplement. Gradually increase how much fiber you consume. If you increase your intake of dietary fiber too quickly, you may have bloating, cramping, or gas. Drink plenty of water to help you digest fiber. Choose high-fiber snacks, such as berries, raw vegetables, nuts, and popcorn. What foods should I eat? Fruits Berries. Pears. Apples. Oranges. Avocado. Prunes and raisins. Dried figs. Vegetables Sweet potatoes. Spinach. Kale. Artichokes. Cabbage. Broccoli. Cauliflower. Green peas. Carrots. Squash. Grains Whole-grain breads. Multigrain cereal. Oats and oatmeal. Brown rice. Barley. Bulgur wheat. [place]. Quinoa. Bran muffins. Popcorn. Rye wafer crackers. Meats and other proteins Navy beans, kidney beans, and pinto beans. Soybeans. Split peas. Lentils. Nuts and seeds. Dairy Fiber-fortified yogurt. Beverages Fiber-fortified soy milk. Fiber-fortified orange juice. Other foods Fiber bars. The items listed above may not be a complete list of recommended foods and beverages. Contact a dietitian for more information. What foods should I avoid? Fruits Fruit juice. Cooked, strained fruit. Vegetables Fried potatoes. Canned vegetables. Well-cooked vegetables. Grains White bread. Pasta made with refined flour. White rice. Meats and other proteins Fatty cuts of meat. Fried chicken or fried fish. Dairy Milk. Yogurt. Cream cheese. Sour cream. Fats and oils Butters. Beverages Soft drinks. Other foods Cakes and pastries. The items listed above may not be a complete list of foods and beverages to avoid. Talk with your dietitian about what choices are best for you. Summary Fiber is a type of carbohydrate. It is found in foods such as fruits, vegetables, whole grains, and beans. A  high-fiber diet has many benefits. It can help to prevent constipation, lower blood cholesterol, aid weight loss, and reduce your risk of heart disease, diabetes, and certain cancers. Increase your intake of fiber gradually. Increasing fiber too quickly may cause cramping, bloating, and gas. Drink plenty of water while you increase the amount of fiber you consume. The best sources of fiber include whole fruits and vegetables, whole grains, nuts, seeds, and beans. This information is not intended to replace advice given to you by your health care provider. Make sure you discuss any questions you have with your health care provider. Document Revised: 04/05/2020 Document Reviewed: 04/05/2020 Elsevier Patient Education  2022 [employer].

## 2021-09-06 LAB — VITAMIN D 25 HYDROXY (VIT D DEFICIENCY, FRACTURES): Vit D, 25-Hydroxy: 37 ng/mL (ref 30–100)

## 2021-09-06 LAB — COMPLETE METABOLIC PANEL WITH GFR
AG Ratio: 1.6 (calc) (ref 1.0–2.5)
ALT: 12 U/L (ref 6–29)
AST: 15 U/L (ref 10–35)
Albumin: 4.5 g/dL (ref 3.6–5.1)
Alkaline phosphatase (APISO): 83 U/L (ref 37–153)
BUN: 16 mg/dL (ref 7–25)
CO2: 30 mmol/L (ref 20–32)
Calcium: 9.7 mg/dL (ref 8.6–10.4)
Chloride: 102 mmol/L (ref 98–110)
Creat: 0.77 mg/dL (ref 0.50–1.03)
Globulin: 2.8 g/dL (calc) (ref 1.9–3.7)
Glucose, Bld: 81 mg/dL (ref 65–99)
Potassium: 4.3 mmol/L (ref 3.5–5.3)
Sodium: 139 mmol/L (ref 135–146)
Total Bilirubin: 0.6 mg/dL (ref 0.2–1.2)
Total Protein: 7.3 g/dL (ref 6.1–8.1)
eGFR: 92 mL/min/{1.73_m2} (ref >=60)

## 2021-09-06 LAB — LIPID PANEL
Cholesterol: 210 mg/dL — ABNORMAL HIGH (ref <200)
HDL: 75 mg/dL (ref >=50)
LDL Cholesterol (Calc): 119 mg/dL (calc) — ABNORMAL HIGH
Non-HDL Cholesterol (Calc): 135 mg/dL (calc) — ABNORMAL HIGH (ref <130)
Total CHOL/HDL Ratio: 2.8 (calc) (ref <5.0)
Triglycerides: 68 mg/dL (ref <150)

## 2021-09-06 LAB — CBC WITH DIFFERENTIAL/PLATELET
Absolute Monocytes: 694 cells/uL (ref 200–950)
Basophils Absolute: 28 cells/uL (ref 0–200)
Basophils Relative: 0.5 %
Eosinophils Absolute: 168 cells/uL (ref 15–500)
Eosinophils Relative: 3 %
HCT: 40.9 % (ref 35.0–45.0)
Hemoglobin: 13.4 g/dL (ref 11.7–15.5)
Lymphs Abs: 1809 cells/uL (ref 850–3900)
MCH: 28.5 pg (ref 27.0–33.0)
MCHC: 32.8 g/dL (ref 32.0–36.0)
MCV: 86.8 fL (ref 80.0–100.0)
MPV: 10.5 fL (ref 7.5–12.5)
Monocytes Relative: 12.4 %
Neutro Abs: 2901 cells/uL (ref 1500–7800)
Neutrophils Relative %: 51.8 %
Platelets: 228 10*3/uL (ref 140–400)
RBC: 4.71 10*6/uL (ref 3.80–5.10)
RDW: 11.9 % (ref 11.0–15.0)
Total Lymphocyte: 32.3 %
WBC: 5.6 10*3/uL (ref 3.8–10.8)

## 2021-09-06 LAB — URINALYSIS, ROUTINE W REFLEX MICROSCOPIC
Bilirubin Urine: NEGATIVE
Glucose, UA: NEGATIVE
Hgb urine dipstick: NEGATIVE
Ketones, ur: NEGATIVE
Leukocytes,Ua: NEGATIVE
Nitrite: NEGATIVE
Protein, ur: NEGATIVE
Specific Gravity, Urine: 1.009 (ref 1.001–1.035)
pH: 7 (ref 5.0–8.0)

## 2021-09-06 LAB — TSH: TSH: 1.9 mIU/L

## 2021-09-06 LAB — HEMOGLOBIN A1C
Hgb A1c MFr Bld: 5.3 % of total Hgb (ref <5.7)
Mean Plasma Glucose: 105 mg/dL
eAG (mmol/L): 5.8 mmol/L

## 2021-09-06 LAB — VITAMIN B12: Vitamin B-12: 501 pg/mL (ref 200–1100)

## 2021-09-12 ENCOUNTER — Encounter: Payer: Self-pay | Admitting: Internal Medicine

## 2021-11-06 ENCOUNTER — Other Ambulatory Visit: Payer: Self-pay | Admitting: Adult Health

## 2021-11-06 DIAGNOSIS — M7742 Metatarsalgia, left foot: Secondary | ICD-10-CM

## 2022-01-21 ENCOUNTER — Other Ambulatory Visit: Payer: Self-pay | Admitting: Adult Health

## 2022-01-21 DIAGNOSIS — M7742 Metatarsalgia, left foot: Secondary | ICD-10-CM

## 2022-09-05 ENCOUNTER — Encounter: Payer: Self-pay | Admitting: Nurse Practitioner

## 2022-09-05 ENCOUNTER — Encounter: Payer: 59 | Admitting: Nurse Practitioner

## 2022-09-05 ENCOUNTER — Ambulatory Visit (INDEPENDENT_AMBULATORY_CARE_PROVIDER_SITE_OTHER): Payer: 59 | Admitting: Nurse Practitioner

## 2022-09-05 VITALS — BP 120/80 | HR 61 | Temp 97.7°F | Ht 65.0 in | Wt 156.4 lb

## 2022-09-05 DIAGNOSIS — E7841 Elevated Lipoprotein(a): Secondary | ICD-10-CM

## 2022-09-05 DIAGNOSIS — I10 Essential (primary) hypertension: Secondary | ICD-10-CM

## 2022-09-05 DIAGNOSIS — R5383 Other fatigue: Secondary | ICD-10-CM

## 2022-09-05 DIAGNOSIS — L720 Epidermal cyst: Secondary | ICD-10-CM

## 2022-09-05 DIAGNOSIS — Z0000 Encounter for general adult medical examination without abnormal findings: Secondary | ICD-10-CM | POA: Diagnosis not present

## 2022-09-05 DIAGNOSIS — Z6825 Body mass index (BMI) 25.0-25.9, adult: Secondary | ICD-10-CM

## 2022-09-05 DIAGNOSIS — Z131 Encounter for screening for diabetes mellitus: Secondary | ICD-10-CM

## 2022-09-05 DIAGNOSIS — G8929 Other chronic pain: Secondary | ICD-10-CM

## 2022-09-05 DIAGNOSIS — Z1389 Encounter for screening for other disorder: Secondary | ICD-10-CM

## 2022-09-05 DIAGNOSIS — Z136 Encounter for screening for cardiovascular disorders: Secondary | ICD-10-CM

## 2022-09-05 DIAGNOSIS — Z0001 Encounter for general adult medical examination with abnormal findings: Secondary | ICD-10-CM

## 2022-09-05 DIAGNOSIS — Z1329 Encounter for screening for other suspected endocrine disorder: Secondary | ICD-10-CM

## 2022-09-05 DIAGNOSIS — Z85828 Personal history of other malignant neoplasm of skin: Secondary | ICD-10-CM

## 2022-09-05 DIAGNOSIS — Z79899 Other long term (current) drug therapy: Secondary | ICD-10-CM

## 2022-09-05 DIAGNOSIS — E559 Vitamin D deficiency, unspecified: Secondary | ICD-10-CM

## 2022-09-05 NOTE — Patient Instructions (Addendum)

## 2022-09-05 NOTE — Progress Notes (Signed)
Complete Physical  Assessment and Plan:  Encounter for Annual Physical Exam with abnormal findings Due annually  Health Maintenance reviewed Healthy lifestyle reviewed and goals set  Elevated lipoprotein(a) Discussed lifestyle modifications. Recommended diet heavy in fruits and veggies, omega 3's. Decrease consumption of animal meats, cheeses, and dairy products. Remain active and exercise as tolerated. Continue to monitor. Check lipids/TSH   Atheroma, skin Monitor Check and monitor lipids   Perennial allergic rhinitis Continue H2i PRN Avoid triggers  History of BCC Encourage sunscreen,  Derm follows closely   Vitamin D deficiency Continue supplement Monitor levels  Medication management All medications discussed and reviewed in full. All questions and concerns regarding medications addressed.    Screening for thyroid disorder -     TSH  Screening diabetes       -     A1C  Screening for hematuria or proteinuria -     Urinalysis, Routine w reflex microscopic  Fatigue - Check B12, CBC, TSH  BMI 25 Discussed appropriate BMI Diet modification. Physical activity. Encouraged/praised to build confidence.   Left shoulder pain Intermittent OTC anti-inflammatory PRN Continue to monitor  Orders Placed This Encounter  Procedures  . CBC with Differential/Platelet  . COMPLETE METABOLIC PANEL WITH GFR  . Magnesium  . Lipid panel  . TSH  . Hemoglobin A1c  . VITAMIN D 25 Hydroxy (Vit-D Deficiency, Fractures)  . Urinalysis, Routine w reflex microscopic  . EKG 12-Lead     Discussed med's effects and SE's. Screening labs and tests as requested with regular follow-up as recommended.  Over 40 minutes of exam, counseling, chart review, and complex, high level critical decision making was performed this visit.  Future Appointments  Date Time Provider [place]  09/08/2023  9:00 AM Darrol Jump, NP [facility] None    HPI  56 y.o. DWF female   presents for a complete physical and follow up for has Mixed hyperlipidemia; Atheroma, skin; Perennial allergic rhinitis; Medication management; Vitamin D deficiency; Elevated lipoprotein(a); and History of basal cell cancer on their problem list.  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 Was adopted, unknown family history. She is dating, 2 children, 7 y/o daughter and 65 y/o son - studying to be a Insurance underwriter.   She had Covid mid-Aug.  She had some minimal symptoms with some difficulty breathing with some and had some right eye ulcer.  She is following annually with Dr. Melba Coon for PAP/mammo, postmenopausal s/p ablation, [facility].   She has intermittent joint stiffness/soreness, has left shoulder possible impingement, working with a Physiological scientist, stretching exercises, being very careful.   Hx of BCC followed by Dr. Delman Cheadle.   BMI is There is no height or weight on file to calculate BMI., she has been working on diet and exercise. She is working with a Physiological scientist. She admits struggles with veggie texture, does smoothie in AM with fruit ad greens.  Caffeine - 1 coffee in AM, 1 small diet mountain dew in AM Water - 64 fluid ounces  Wt Readings from Last 3 Encounters:  09/05/21 156 lb 12.8 oz (71.1 kg)  10/01/20 153 lb (69.4 kg)  08/31/20 151 lb (68.5 kg)   Her blood pressure has been controlled at home, today their BP is   She does workout. She denies chest pain, shortness of breath, dizziness.   She is not on cholesterol medication and denies myalgias.  Her cholesterol is not at goal. She has elevated LP(a) 195 in 08/2019.  Non smoker. Was adopted, unknown family history.  Had CT coronary calcium score of 0 on 09/12/2020.  The cholesterol last visit was:   Lab Results  Component Value Date   CHOL 210 (H) 09/05/2021   HDL 75 09/05/2021   LDLCALC 119 (H) 09/05/2021   TRIG 68 09/05/2021   CHOLHDL 2.8 09/05/2021   Last A1C in the office was:   Lab Results  Component Value Date   HGBA1C 5.3 09/05/2021   GFR Lab Results  Component Value Date   GFRNONAA 99 10/01/2020   Patient is on Vitamin D supplement., 5000 IU daily.  Lab Results  Component Value Date   VD25OH 37 09/05/2021       Current Medications:  Current Outpatient Medications on File Prior to Visit  Medication Sig Dispense Refill  . CHOLECALCIFEROL PO Take 4,000 Units by mouth daily.    . meloxicam (MOBIC) 15 MG tablet TAKE ONE TABLET BY MOUTH DAILY WITH FOOD FOR 2 WEEKS, THEN AS NEEDED FOR PAIN/ INFLAMMATION. 30 tablet 1  . Omega-3 Fatty Acids (OMEGA-3 PO) Take by mouth daily.    Marland Kitchen VITAMIN D PO Take by mouth daily.     Current Facility-Administered Medications on File Prior to Visit  Medication Dose Route Frequency Provider Last Rate Last Admin  . 0.9 %  sodium chloride infusion  500 mL Intravenous Continuous Danis, Estill Cotta III, MD       Allergies:  No Known Allergies   Medical History:  She has Mixed hyperlipidemia; Atheroma, skin; Perennial allergic rhinitis; Medication management; Vitamin D deficiency; Elevated lipoprotein(a); and History of basal cell cancer on their problem list.   Health Maintenance:   Immunization History  Administered Date(s) Administered  . DTaP 09/14/2009  . PPD Test 11/14/2013  . Pneumococcal-Unspecified 01/16/1998  . Td 02/06/1995  . Tdap 09/01/2019   Tetanus: 2020 Pneumovax:1999 Prevnar 13:  Flu vaccine: 09/2022 Due  Shingrix: considering - will check with insurance Covid 19: has had 2/2 pfizer -   Pap: Dr. Roxy Horseman negative, last reported  MGM: 2021 at GYN - has upcoming scheduled DEXA: N/A  Colonoscopy: 2018 Dr. Loletha Carrow, 10 years.  EGD: N/A  Patient Care Team: Unk Pinto, MD as PCP - General (Internal Medicine) Loletha Carrow, Kirke Corin, MD as Consulting Physician (Gastroenterology) Jari Pigg, MD as Consulting Physician (Dermatology)  Surgical History:  She has a past surgical history that includes  Excision basal cell carcinoma; Dilatation & currettage/hysteroscopy with versapoint resection (N/A, 07/29/2013); Novasure ablation (N/A, 07/29/2013); and Wisdom tooth extraction. Family History:  Herfamily history is not on file. She was adopted. Social History:  She reports that she has never smoked. She has never used smokeless tobacco. She reports that she does not currently use alcohol. She reports that she does not use drugs.  Review of Systems: Review of Systems  Constitutional: Negative.  Negative for malaise/fatigue and weight loss.  HENT: Negative.  Negative for hearing loss and tinnitus.   Eyes: Negative.  Negative for blurred vision and double vision.  Respiratory: Negative.  Negative for cough, shortness of breath and wheezing.   Cardiovascular: Negative.  Negative for chest pain, palpitations, orthopnea, claudication and leg swelling.  Gastrointestinal: Negative.  Negative for abdominal pain, blood in stool, constipation, diarrhea, heartburn, melena, nausea and vomiting.  Genitourinary: Negative.   Musculoskeletal:  Positive for joint pain (left shoulder, mild, intermittent). Negative for myalgias.  Skin: Negative.  Negative for rash.  Neurological: Negative.  Negative for dizziness, tingling, sensory change, weakness and headaches.  Endo/Heme/Allergies: Negative.  Negative for polydipsia.  Psychiatric/Behavioral: Negative.    All other systems reviewed and are negative.   Physical Exam: Estimated body mass index is 26.29 kg/m as calculated from the following:   Height as of 09/05/21: 5' 4.75" (1.645 m).   Weight as of 09/05/21: 156 lb 12.8 oz (71.1 kg). There were no vitals taken for this visit. General Appearance: Well nourished, in no apparent distress.  Eyes: PERRLA, EOMs, conjunctiva no swelling or erythema, normal fundi and vessels.  Sinuses: No Frontal/maxillary tenderness  ENT/Mouth: Ext aud canals clear, normal light reflex with TMs without erythema, bulging. Good  dentition. No erythema, swelling, or exudate on post pharynx. Tonsils not swollen or erythematous. Hearing normal.  Neck: Supple, thyroid normal. No bruits  Respiratory: Respiratory effort normal, BS equal bilaterally without rales, rhonchi, wheezing or stridor.  Cardio: RRR without murmurs, rubs or gallops. Brisk peripheral pulses without edema.  Chest: symmetric, with normal excursions and percussion.  Breasts: defer to GYN Abdomen: Soft, nontender, no guarding, rebound, hernias, masses, or organomegaly.  Lymphatics: Non tender without lymphadenopathy.  Genitourinary: defer to GYN Musculoskeletal: Full ROM all peripheral extremities,5/5 strength, and normal gait. She has some discomfort with L shoulder abduction against resistance past 90 degrees.  Skin: Warm, dry without rashes, lesions, ecchymosis. Neuro: Cranial nerves intact, reflexes equal bilaterally. Normal muscle tone, no cerebellar symptoms. Sensation intact.  Psych: Awake and oriented X 3, normal affect, Insight and Judgment appropriate.   EKG: defer, no sx AORTA SCAN: declines   Wayman Hoard 9:08 AM [facility]

## 2022-09-06 LAB — URINALYSIS, ROUTINE W REFLEX MICROSCOPIC
Bilirubin Urine: NEGATIVE
Glucose, UA: NEGATIVE
Hgb urine dipstick: NEGATIVE
Ketones, ur: NEGATIVE
Leukocytes,Ua: NEGATIVE
Nitrite: NEGATIVE
Protein, ur: NEGATIVE
Specific Gravity, Urine: 1.023 (ref 1.001–1.035)
pH: 6.5 (ref 5.0–8.0)

## 2022-09-06 LAB — CBC WITH DIFFERENTIAL/PLATELET
Absolute Monocytes: 504 cells/uL (ref 200–950)
Basophils Absolute: 49 cells/uL (ref 0–200)
Basophils Relative: 0.7 %
Eosinophils Absolute: 182 cells/uL (ref 15–500)
Eosinophils Relative: 2.6 %
HCT: 40.1 % (ref 35.0–45.0)
Hemoglobin: 13.4 g/dL (ref 11.7–15.5)
Lymphs Abs: 2590 cells/uL (ref 850–3900)
MCH: 28.9 pg (ref 27.0–33.0)
MCHC: 33.4 g/dL (ref 32.0–36.0)
MCV: 86.4 fL (ref 80.0–100.0)
MPV: 10.4 fL (ref 7.5–12.5)
Monocytes Relative: 7.2 %
Neutro Abs: 3675 cells/uL (ref 1500–7800)
Neutrophils Relative %: 52.5 %
Platelets: 226 10*3/uL (ref 140–400)
RBC: 4.64 10*6/uL (ref 3.80–5.10)
RDW: 12.4 % (ref 11.0–15.0)
Total Lymphocyte: 37 %
WBC: 7 10*3/uL (ref 3.8–10.8)

## 2022-09-06 LAB — COMPLETE METABOLIC PANEL WITH GFR
AG Ratio: 1.9 (calc) (ref 1.0–2.5)
ALT: 10 U/L (ref 6–29)
AST: 14 U/L (ref 10–35)
Albumin: 4.7 g/dL (ref 3.6–5.1)
Alkaline phosphatase (APISO): 66 U/L (ref 37–153)
BUN: 22 mg/dL (ref 7–25)
CO2: 29 mmol/L (ref 20–32)
Calcium: 9.6 mg/dL (ref 8.6–10.4)
Chloride: 103 mmol/L (ref 98–110)
Creat: 0.77 mg/dL (ref 0.50–1.03)
Globulin: 2.5 g/dL (calc) (ref 1.9–3.7)
Glucose, Bld: 87 mg/dL (ref 65–99)
Potassium: 4.2 mmol/L (ref 3.5–5.3)
Sodium: 140 mmol/L (ref 135–146)
Total Bilirubin: 0.5 mg/dL (ref 0.2–1.2)
Total Protein: 7.2 g/dL (ref 6.1–8.1)
eGFR: 91 mL/min/{1.73_m2} (ref >=60)

## 2022-09-06 LAB — LIPID PANEL
Cholesterol: 203 mg/dL — ABNORMAL HIGH (ref <200)
HDL: 73 mg/dL (ref >=50)
LDL Cholesterol (Calc): 113 mg/dL (calc) — ABNORMAL HIGH
Non-HDL Cholesterol (Calc): 130 mg/dL (calc) — ABNORMAL HIGH (ref <130)
Total CHOL/HDL Ratio: 2.8 (calc) (ref <5.0)
Triglycerides: 77 mg/dL (ref <150)

## 2022-09-06 LAB — HEMOGLOBIN A1C
Hgb A1c MFr Bld: 5.4 % of total Hgb (ref <5.7)
Mean Plasma Glucose: 108 mg/dL
eAG (mmol/L): 6 mmol/L

## 2022-09-06 LAB — VITAMIN D 25 HYDROXY (VIT D DEFICIENCY, FRACTURES): Vit D, 25-Hydroxy: 84 ng/mL (ref 30–100)

## 2022-09-06 LAB — TSH: TSH: 1.57 mIU/L

## 2022-09-06 LAB — MAGNESIUM: Magnesium: 2.2 mg/dL (ref 1.5–2.5)

## 2023-09-08 ENCOUNTER — Encounter: Payer: Self-pay | Admitting: Nurse Practitioner

## 2023-09-08 ENCOUNTER — Ambulatory Visit (INDEPENDENT_AMBULATORY_CARE_PROVIDER_SITE_OTHER): Payer: 59 | Admitting: Nurse Practitioner

## 2023-09-08 VITALS — BP 122/78 | HR 59 | Temp 97.8°F | Ht 64.5 in | Wt 160.2 lb

## 2023-09-08 DIAGNOSIS — F439 Reaction to severe stress, unspecified: Secondary | ICD-10-CM

## 2023-09-08 DIAGNOSIS — L720 Epidermal cyst: Secondary | ICD-10-CM

## 2023-09-08 DIAGNOSIS — Z1329 Encounter for screening for other suspected endocrine disorder: Secondary | ICD-10-CM

## 2023-09-08 DIAGNOSIS — G8929 Other chronic pain: Secondary | ICD-10-CM

## 2023-09-08 DIAGNOSIS — I10 Essential (primary) hypertension: Secondary | ICD-10-CM

## 2023-09-08 DIAGNOSIS — Z0000 Encounter for general adult medical examination without abnormal findings: Secondary | ICD-10-CM

## 2023-09-08 DIAGNOSIS — Z85828 Personal history of other malignant neoplasm of skin: Secondary | ICD-10-CM

## 2023-09-08 DIAGNOSIS — R4189 Other symptoms and signs involving cognitive functions and awareness: Secondary | ICD-10-CM

## 2023-09-08 DIAGNOSIS — Z136 Encounter for screening for cardiovascular disorders: Secondary | ICD-10-CM

## 2023-09-08 DIAGNOSIS — R5383 Other fatigue: Secondary | ICD-10-CM

## 2023-09-08 DIAGNOSIS — Z79899 Other long term (current) drug therapy: Secondary | ICD-10-CM

## 2023-09-08 DIAGNOSIS — E7841 Elevated Lipoprotein(a): Secondary | ICD-10-CM

## 2023-09-08 DIAGNOSIS — Z0001 Encounter for general adult medical examination with abnormal findings: Secondary | ICD-10-CM

## 2023-09-08 DIAGNOSIS — E559 Vitamin D deficiency, unspecified: Secondary | ICD-10-CM

## 2023-09-08 DIAGNOSIS — Z6825 Body mass index (BMI) 25.0-25.9, adult: Secondary | ICD-10-CM

## 2023-09-08 DIAGNOSIS — Z131 Encounter for screening for diabetes mellitus: Secondary | ICD-10-CM

## 2023-09-08 DIAGNOSIS — Z1389 Encounter for screening for other disorder: Secondary | ICD-10-CM

## 2023-09-08 MED ORDER — PHENTERMINE HCL 37.5 MG PO TABS
ORAL_TABLET | ORAL | 0 refills | Status: DC
Start: 2023-09-08 — End: 2023-10-22

## 2023-09-08 NOTE — Patient Instructions (Signed)
Phentermine Capsules or Tablets What is this medication? PHENTERMINE (FEN ter meen) promotes weight loss. It works by decreasing appetite. It is often used for a short period of time. Changes to diet and exercise are often combined with this medication. This medicine may be used for other purposes; ask your health care provider or pharmacist if you have questions. COMMON BRAND NAME(S): Adipex-P, Atti-Plex P, Atti-Plex P Spansule, Fastin, Lomaira, Pro-Fast, Pro-Fast HS, Pro-Fast SA, Tara-8 What should I tell my care team before I take this medication? They need to know if you have any of these conditions: Agitation or nervousness Diabetes Glaucoma Heart disease High blood pressure History of substance use disorder History of stroke Kidney disease Lung disease called Primary Pulmonary Hypertension (PPH) Taken an MAOI, such as Carbex, Eldepryl, Marplan, Nardil, or Parnate in last 14 days Taking stimulant medications for attention disorders, weight loss, or to stay awake Thyroid disease An unusual or allergic reaction to phentermine, other medications, foods, dyes, or preservatives Pregnant or trying to get pregnant Breastfeeding How should I use this medication? Take this medication by mouth with a glass of water. Follow the directions on the prescription label. Take your medication at regular intervals. Do not take it more often than directed. Do not stop taking except on your care team's advice. Talk to your care team about the use of this medication in children. While this medication may be prescribed for children 17 years or older for selected conditions, precautions do apply. Overdosage: If you think you have taken too much of this medicine contact a poison control center or emergency room at once. NOTE: This medicine is only for you. Do not share this medicine with others. What if I miss a dose? If you miss a dose, take it as soon as you can. If it is almost time for your next dose,  take only that dose. Do not take double or extra doses. What may interact with this medication? Do not take this medication with any of the following: MAOIs, such as Carbex, Eldepryl, Marplan, Nardil, and Parnate This medication may also interact with the following: Alcohol Certain medications for depression, anxiety, or other mental health conditions Certain medications for blood pressure Linezolid Medications for colds or breathing difficulties, such as pseudoephedrine or phenylephrine Medications for diabetes Sibutramine Stimulant medications for ADHD, weight loss, or staying awake This list may not describe all possible interactions. Give your health care provider a list of all the medicines, herbs, non-prescription drugs, or dietary supplements you use. Also tell them if you smoke, drink alcohol, or use illegal drugs. Some items may interact with your medicine. What should I watch for while using this medication? Visit your care team for regular checks on your progress. Do not stop taking except on your care team's advice. You may develop a severe reaction. Your care team will tell you how much medication to take. Do not take this medication close to bedtime. It may prevent you from sleeping. This medication may affect your coordination, reaction time, or judgment. Do not drive or operate machinery until you know how this medication affects you. Sit up or stand slowly to reduce the risk of dizzy or fainting spells. Drinking alcohol with this medication can increase the risk of these side effects. This medication may affect blood sugar levels. Ask your care team if changes in diet or medications are needed if you have diabetes. Inform your care team if you wish to become pregnant or think you might be pregnant. Losing  weight while pregnant is not advised and may cause harm to the unborn child. Talk to your care team for more information. What side effects may I notice from receiving this  medication? Side effects that you should report to your care team as soon as possible: Allergic reactions--skin rash, itching, hives, swelling of the face, lips, tongue, or throat Heart valve disease--shortness of breath, chest pain, unusual weakness or fatigue, dizziness, feeling faint or lightheaded, fever, sudden weight gain, fast or irregular heartbeat Pulmonary hypertension--shortness of breath, chest pain, fast or irregular heartbeat, feeling faint or lightheaded, fatigue, swelling of the ankles or feet Side effects that usually do not require medical attention (report to your care team if they continue or are bothersome): Change in taste Diarrhea Dizziness Dry mouth Restlessness Trouble sleeping This list may not describe all possible side effects. Call your doctor for medical advice about side effects. You may report side effects to FDA at 1-800-FDA-1088. Where should I keep my medication? Keep out of the reach of children. This medication can be abused. Keep your medication in a safe place to protect it from theft. Do not share this medication with anyone. Selling or giving away this medication is dangerous and against the law. This medication may cause harm and death if it is taken by other adults, children, or pets. Return medication that has not been used to an official disposal site. Contact the DEA at (636) 139-0883 or your city/county government to find a site. If you cannot return the medication, mix any unused medication with a substance like cat litter or coffee grounds. Then throw the medication away in a sealed container like a sealed bag or coffee can with a lid. Do not use the medication after the expiration date. Store at room temperature between 20 and 25 degrees C (68 and 77 degrees F). Keep container tightly closed. NOTE: This sheet is a summary. It may not cover all possible information. If you have questions about this medicine, talk to your doctor, pharmacist, or health  care provider.  2024 Elsevier/Gold Standard (2022-06-11 00:00:00)

## 2023-09-08 NOTE — Progress Notes (Signed)
Complete Physical  Assessment and Plan:  Encounter for Annual Physical Exam with abnormal findings Due annually  Health Maintenance reviewed Healthy lifestyle reviewed and goals set  Elevated lipoprotein(a) Continue lifestyle modifications. Recommended diet heavy in fruits and veggies, omega 3's. Decrease consumption of animal meats, cheeses, and dairy products. Remain active and exercise as tolerated. Continue to monitor. Check lipids/TSH  Atheroma, skin Dermatology following Check and monitor lipids   Perennial allergic rhinitis Continue H2i PRN Suggested local honey 1 tbsp daily Avoid triggers  History of BCC Encourage sunscreen,  Derm follows closely   Vitamin D deficiency Continue supplement Monitor levels  Medication management All medications discussed and reviewed in full. All questions and concerns regarding medications addressed.    Screening for thyroid disorder -     TSH  Screening diabetes       -     A1C  Screening for hematuria or proteinuria -     Urinalysis, Routine w reflex microscopic   BMI 27 Start Phentermine as directed. Discussed appropriate BMI Diet modification. Physical activity. Encouraged/praised to build confidence.   Left shoulder pain Intermittent OTC anti-inflammatory PRN Continue to monitor  Screening for cardiovascular condition EKG  Other Fatigue/Brain fog Start Phentermine as directed Review of PDMP completed  Push fluids Monitor CBC, B12  Orders Placed This Encounter  Procedures   CBC with Differential/Platelet   COMPLETE METABOLIC PANEL WITH GFR   Lipid panel   TSH   Hemoglobin A1c   Insulin, random   VITAMIN D 25 Hydroxy (Vit-D Deficiency, Fractures)   Urinalysis, Routine w reflex microscopic   Microalbumin / creatinine urine ratio   Vitamin B12   Cortisol   EKG 12-Lead   Meds ordered this encounter  Medications   phentermine (ADIPEX-P) 37.5 MG tablet    Sig: Take 1/2 to 1 tablet every  Morning for Dieting & Weight Loss    Dispense:  30 tablet    Refill:  0    Order Specific Question:   Supervising Provider    Answer:   Lucky Cowboy [phone]    Notify office for further evaluation and treatment, questions or concerns if any reported s/s fail to improve.   The patient was advised to call back or seek an in-person evaluation if any symptoms worsen or if the condition fails to improve as anticipated.   Further disposition pending results of labs. Discussed med's effects and SE's.    I discussed the assessment and treatment plan with the patient. The patient was provided an opportunity to ask questions and all were answered. The patient agreed with the plan and demonstrated an understanding of the instructions.  Discussed med's effects and SE's. Screening labs and tests as requested with regular follow-up as recommended.  I provided 35 minutes of face-to-face time during this encounter including counseling, chart review, and critical decision making was preformed.  Today's Plan of Care is based on a patient-centered health care approach known as shared decision making - the decisions, tests and treatments allow for patient preferences and values to be balanced with clinical evidence.     Future Appointments  Date Time Provider Department Center  09/07/2024  9:00 AM Adela Glimpse, NP [facility] None    HPI  57 y.o. DWF female  presents for a complete physical and follow up for has Mixed hyperlipidemia; Atheroma, skin; Perennial allergic rhinitis; Medication management; Vitamin D deficiency; Elevated lipoprotein(a); and History of basal cell cancer on their problem list.  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 Was adopted, unknown family history. She recently met birth mother and is getting to know her.  She has now seen her a few times and will have lunch with her today.  She has 2 children.  Recently returned from a trip to [place] to visit her  son who is a Occupational hygienist.  Overall she reports feeling well today however she has noticed increase in fatigue, disorganization and brain fog.  Not wanting to "jump out of the bed" and start the day.  Sits around until 11 am.  Having some trouble focusing at work, increase in stress and some irritability.  Harder to recall things.  Has also noticed increase in weight gain.  She does admit to working out x2 weekly and walking.     BMI is There is no height or weight on file to calculate BMI., she has been working on diet and exercise. She is working with a Systems analyst.   BMI is Body mass index is 27.07 kg/m., she has been working on diet and exercise but unable to keep however not successful over the last year.  Wt Readings from Last 3 Encounters:  09/08/23 160 lb 3.2 oz (72.7 kg)  09/05/22 156 lb 6.4 oz (70.9 kg)  09/05/21 156 lb 12.8 oz (71.1 kg)   She is following annually with Dr. Ellyn Hack for PAP/mammo, postmenopausal s/p ablation, [facility].   She has intermittent joint stiffness/soreness, has left shoulder possible impingement, working with a Systems analyst, stretching exercises, being very careful.   Has overall reports of fatigue, B12 has been low end of normal in the past.  She takes daily supplement.   Hx of BCC followed by Dr. Emily Filbert.   Her blood pressure has been controlled at home, today their BP is BP: 122/78 She does workout. She denies chest pain, shortness of breath, dizziness.   She is not on cholesterol medication and denies myalgias.  Her cholesterol is not at goal. She has elevated LP(a) 195 in 08/2019.  Non smoker. Had CT coronary calcium score of 0 on 09/12/2020.  The cholesterol last visit was:   Lab Results  Component Value Date   CHOL 203 (H) 09/05/2022   HDL 73 09/05/2022   LDLCALC 113 (H) 09/05/2022   TRIG 77 09/05/2022   CHOLHDL 2.8 09/05/2022   Last Z6X in the office was:  Lab Results  Component Value Date   HGBA1C 5.4 09/05/2022    GFR Lab Results  Component Value Date   GFRNONAA 99 10/01/2020   Patient is on Vitamin D supplement., 5000 IU daily.  Lab Results  Component Value Date   VD25OH 69 09/05/2022       Current Medications:  Current Outpatient Medications on File Prior to Visit  Medication Sig Dispense Refill   CHOLECALCIFEROL PO Take 4,000 Units by mouth daily.     Cyanocobalamin (B12 FAST DISSOLVE PO) Take by mouth daily.     Probiotic Product (PROBIOTIC DAILY PO) Take by mouth daily.     meloxicam (MOBIC) 15 MG tablet TAKE ONE TABLET BY MOUTH DAILY WITH FOOD FOR 2 WEEKS, THEN AS NEEDED FOR PAIN/ INFLAMMATION. 30 tablet 1   Omega-3 Fatty Acids (OMEGA-3 PO) Take by mouth daily. (Patient not taking: Reported on 09/08/2023)     VITAMIN D PO Take by mouth daily.     Current Facility-Administered Medications on File Prior to Visit  Medication Dose Route Frequency Provider Last Rate Last Admin   0.9 %  sodium chloride infusion  500 mL Intravenous Continuous Danis, Starr Lake III, MD       Allergies:  No Known Allergies   Medical History:  She has Mixed hyperlipidemia; Atheroma, skin; Perennial allergic rhinitis; Medication management; Vitamin D deficiency; Elevated lipoprotein(a); and History of basal cell cancer on their problem list.   Health Maintenance:   Immunization History  Administered Date(s) Administered   DTaP 09/14/2009   PPD Test 11/14/2013   Pneumococcal-Unspecified 01/16/1998   Td 02/06/1995   Tdap 09/01/2019   Tetanus: 2020 Pneumovax:1999 Prevnar 13:  Flu vaccine: 09/2022 Due  Shingrix: considering - will check with insurance Covid 19: has had 2/2 pfizer -   Pap: Dr. Ladora Daniel negative, last reported  MGM: 2024 at GYN - has upcoming scheduled DEXA: N/A  Colonoscopy: 2018 Dr. Myrtie Neither, 10 years.  EGD: N/A  Patient Care Team: Lucky Cowboy, MD as PCP - General (Internal Medicine) Myrtie Neither, Andreas Blower, MD as Consulting Physician (Gastroenterology) Elmon Else, MD as  Consulting Physician (Dermatology)  Surgical History:  She has a past surgical history that includes Excision basal cell carcinoma; Dilatation & currettage/hysteroscopy with versapoint resection (N/A, 07/29/2013); Novasure ablation (N/A, 07/29/2013); and Wisdom tooth extraction. Family History:  Herfamily history is not on file. She was adopted. Social History:  She reports that she has never smoked. She has never used smokeless tobacco. She reports that she does not currently use alcohol. She reports that she does not use drugs.  Review of Systems: Review of Systems  Constitutional: Negative.  Negative for malaise/fatigue and weight loss.  HENT: Negative.  Negative for hearing loss and tinnitus.   Eyes: Negative.  Negative for blurred vision and double vision.  Respiratory: Negative.  Negative for cough, shortness of breath and wheezing.   Cardiovascular: Negative.  Negative for chest pain, palpitations, orthopnea, claudication and leg swelling.  Gastrointestinal: Negative.  Negative for abdominal pain, blood in stool, constipation, diarrhea, heartburn, melena, nausea and vomiting.  Genitourinary: Negative.   Musculoskeletal:  Positive for joint pain (left shoulder, mild, intermittent). Negative for myalgias.  Skin: Negative.  Negative for rash.  Neurological: Negative.  Negative for dizziness, tingling, sensory change, weakness and headaches.  Endo/Heme/Allergies: Negative.  Negative for polydipsia.  Psychiatric/Behavioral: Negative.    All other systems reviewed and are negative.   Physical Exam: Estimated body mass index is 27.07 kg/m as calculated from the following:   Height as of this encounter: 5' 4.5" (1.638 m).   Weight as of this encounter: 160 lb 3.2 oz (72.7 kg). BP 122/78   Pulse (!) 59   Temp 97.8 F (36.6 C)   Ht 5' 4.5" (1.638 m)   Wt 160 lb 3.2 oz (72.7 kg)   SpO2 98%   BMI 27.07 kg/m  General Appearance: Well nourished, in no apparent distress.  Eyes: PERRLA,  EOMs, conjunctiva no swelling or erythema, normal fundi and vessels.  Sinuses: No Frontal/maxillary tenderness  ENT/Mouth: Ext aud canals clear, normal light reflex with TMs without erythema, bulging. Good dentition. No erythema, swelling, or exudate on post pharynx. Tonsils not swollen or erythematous. Hearing normal.  Neck: Supple, thyroid normal. No bruits  Respiratory: Respiratory effort normal, BS equal bilaterally without rales, rhonchi, wheezing or stridor.  Cardio: RRR without murmurs, rubs or gallops. Brisk peripheral pulses without edema.  Chest: symmetric, with normal excursions and percussion.  Breasts: defer to GYN Abdomen: Soft, nontender, no guarding, rebound, hernias, masses, or organomegaly.  Lymphatics: Non tender without lymphadenopathy.  Genitourinary: defer to GYN Musculoskeletal:  Full ROM all peripheral extremities,5/5 strength, and normal gait. She has some discomfort with L shoulder abduction against resistance past 90 degrees.  Skin: Warm, dry without rashes, lesions, ecchymosis. Neuro: Cranial nerves intact, reflexes equal bilaterally. Normal muscle tone, no cerebellar symptoms. Sensation intact.  Psych: Awake and oriented X 3, normal affect, Insight and Judgment appropriate.   EKG: EKG WNL   Simran Mannis 10:51 AM [facility]

## 2023-09-09 LAB — URINALYSIS, ROUTINE W REFLEX MICROSCOPIC
Bilirubin Urine: NEGATIVE
Glucose, UA: NEGATIVE
Hgb urine dipstick: NEGATIVE
Ketones, ur: NEGATIVE
Leukocytes,Ua: NEGATIVE
Nitrite: NEGATIVE
Protein, ur: NEGATIVE
Specific Gravity, Urine: 1.011 (ref 1.001–1.035)
pH: 7 (ref 5.0–8.0)

## 2023-09-09 LAB — COMPLETE METABOLIC PANEL WITH GFR
AG Ratio: 1.8 (calc) (ref 1.0–2.5)
ALT: 12 U/L (ref 6–29)
AST: 13 U/L (ref 10–35)
Albumin: 4.6 g/dL (ref 3.6–5.1)
Alkaline phosphatase (APISO): 74 U/L (ref 37–153)
BUN: 17 mg/dL (ref 7–25)
CO2: 29 mmol/L (ref 20–32)
Calcium: 9.5 mg/dL (ref 8.6–10.4)
Chloride: 102 mmol/L (ref 98–110)
Creat: 0.72 mg/dL (ref 0.50–1.03)
Globulin: 2.5 g/dL (calc) (ref 1.9–3.7)
Glucose, Bld: 89 mg/dL (ref 65–99)
Potassium: 4.1 mmol/L (ref 3.5–5.3)
Sodium: 138 mmol/L (ref 135–146)
Total Bilirubin: 0.6 mg/dL (ref 0.2–1.2)
Total Protein: 7.1 g/dL (ref 6.1–8.1)
eGFR: 98 mL/min/{1.73_m2} (ref >=60)

## 2023-09-09 LAB — CBC WITH DIFFERENTIAL/PLATELET
Absolute Monocytes: 334 cells/uL (ref 200–950)
Basophils Absolute: 53 cells/uL (ref 0–200)
Basophils Relative: 1 %
Eosinophils Absolute: 154 cells/uL (ref 15–500)
Eosinophils Relative: 2.9 %
HCT: 41.5 % (ref 35.0–45.0)
Hemoglobin: 13.2 g/dL (ref 11.7–15.5)
Lymphs Abs: 2480 cells/uL (ref 850–3900)
MCH: 28 pg (ref 27.0–33.0)
MCHC: 31.8 g/dL — ABNORMAL LOW (ref 32.0–36.0)
MCV: 87.9 fL (ref 80.0–100.0)
MPV: 10.5 fL (ref 7.5–12.5)
Monocytes Relative: 6.3 %
Neutro Abs: 2279 cells/uL (ref 1500–7800)
Neutrophils Relative %: 43 %
Platelets: 250 10*3/uL (ref 140–400)
RBC: 4.72 10*6/uL (ref 3.80–5.10)
RDW: 12 % (ref 11.0–15.0)
Total Lymphocyte: 46.8 %
WBC: 5.3 10*3/uL (ref 3.8–10.8)

## 2023-09-09 LAB — LIPID PANEL
Cholesterol: 223 mg/dL — ABNORMAL HIGH (ref <200)
HDL: 67 mg/dL (ref >=50)
LDL Cholesterol (Calc): 134 mg/dL (calc) — ABNORMAL HIGH
Non-HDL Cholesterol (Calc): 156 mg/dL (calc) — ABNORMAL HIGH (ref <130)
Total CHOL/HDL Ratio: 3.3 (calc) (ref <5.0)
Triglycerides: 109 mg/dL (ref <150)

## 2023-09-09 LAB — TSH: TSH: 1.8 mIU/L (ref 0.40–4.50)

## 2023-09-09 LAB — HEMOGLOBIN A1C
Hgb A1c MFr Bld: 5.6 % of total Hgb (ref <5.7)
Mean Plasma Glucose: 114 mg/dL
eAG (mmol/L): 6.3 mmol/L

## 2023-09-09 LAB — MICROALBUMIN / CREATININE URINE RATIO
Creatinine, Urine: 48 mg/dL (ref 20–275)
Microalb, Ur: <0.2 mg/dL

## 2023-09-09 LAB — CORTISOL: Cortisol, Plasma: 7.2 ug/dL

## 2023-09-09 LAB — INSULIN, RANDOM: Insulin: 3.8 u[IU]/mL

## 2023-09-09 LAB — VITAMIN D 25 HYDROXY (VIT D DEFICIENCY, FRACTURES): Vit D, 25-Hydroxy: 54 ng/mL (ref 30–100)

## 2023-09-09 LAB — VITAMIN B12: Vitamin B-12: 606 pg/mL (ref 200–1100)

## 2023-09-10 ENCOUNTER — Encounter: Payer: Self-pay | Admitting: Nurse Practitioner

## 2023-10-22 ENCOUNTER — Other Ambulatory Visit: Payer: Self-pay | Admitting: Nurse Practitioner

## 2023-10-22 MED ORDER — PHENTERMINE HCL 37.5 MG PO TABS: ORAL_TABLET | ORAL | 0 refills | Status: AC

## 2023-10-22 NOTE — Telephone Encounter (Signed)
Refill on phentermine. Pls send to [id number] - [place], [place] - [address]  [address], [place] [address]

## 2023-10-22 NOTE — Addendum Note (Signed)
Addended by: Dionicio Stall on: 10/22/2023 12:24 PM   Modules accepted: Orders

## 2023-11-16 ENCOUNTER — Encounter: Payer: Self-pay | Admitting: Nurse Practitioner

## 2024-05-19 ENCOUNTER — Other Ambulatory Visit: Payer: Self-pay | Admitting: Obstetrics and Gynecology

## 2024-05-19 DIAGNOSIS — Z1231 Encounter for screening mammogram for malignant neoplasm of breast: Secondary | ICD-10-CM

## 2024-08-09 ENCOUNTER — Ambulatory Visit
Admission: RE | Admit: 2024-08-09 | Discharge: 2024-08-09 | Disposition: A | Discharge status: Home / Self Care | Source: Ambulatory Visit | Attending: Obstetrics and Gynecology | Admitting: Obstetrics and Gynecology

## 2024-08-09 DIAGNOSIS — Z1231 Encounter for screening mammogram for malignant neoplasm of breast: Secondary | ICD-10-CM

## 2024-08-18 ENCOUNTER — Other Ambulatory Visit: Payer: Self-pay | Admitting: Obstetrics and Gynecology

## 2024-08-18 DIAGNOSIS — R928 Other abnormal and inconclusive findings on diagnostic imaging of breast: Secondary | ICD-10-CM

## 2024-08-30 ENCOUNTER — Ambulatory Visit: Admission: RE | Admit: 2024-08-30 | Source: Ambulatory Visit

## 2024-08-30 ENCOUNTER — Ambulatory Visit
Admission: RE | Admit: 2024-08-30 | Discharge: 2024-08-30 | Disposition: A | Discharge status: Home / Self Care | Source: Ambulatory Visit | Attending: Obstetrics and Gynecology | Admitting: Obstetrics and Gynecology

## 2024-08-30 DIAGNOSIS — R928 Other abnormal and inconclusive findings on diagnostic imaging of breast: Secondary | ICD-10-CM

## 2024-09-07 ENCOUNTER — Encounter: Payer: 59 | Admitting: Nurse Practitioner
